# Patient Record
Sex: Male | Born: 1960
Health system: Southern US, Community
[De-identification: ages and names within clinical notes are randomized; demographics above are authoritative.]

## PROBLEM LIST (undated history)

## (undated) DIAGNOSIS — N400 Enlarged prostate without lower urinary tract symptoms: Secondary | ICD-10-CM

## (undated) HISTORY — DX: Benign prostatic hyperplasia without lower urinary tract symptoms: N40.0

---

## 2001-01-11 ENCOUNTER — Emergency Department (HOSPITAL_COMMUNITY): Admission: EM | Admit: 2001-01-11 | Discharge: 2001-01-11 | Payer: Self-pay | Admitting: Emergency Medicine

## 2001-01-11 ENCOUNTER — Encounter: Payer: Self-pay | Admitting: Emergency Medicine

## 2007-08-25 ENCOUNTER — Ambulatory Visit: Payer: Self-pay | Admitting: Gastroenterology

## 2011-02-22 ENCOUNTER — Ambulatory Visit: Payer: Self-pay | Admitting: Orthopedic Surgery

## 2011-05-29 HISTORY — PX: KNEE SURGERY: SHX244

## 2011-11-06 ENCOUNTER — Ambulatory Visit: Payer: Self-pay | Admitting: Family Medicine

## 2012-07-01 ENCOUNTER — Ambulatory Visit: Payer: Self-pay | Admitting: Family Medicine

## 2012-12-29 ENCOUNTER — Ambulatory Visit: Payer: Self-pay | Admitting: Family Medicine

## 2013-10-06 IMAGING — CR DG CHEST 2V
1 series · 2 of 2 positions shown · non-contrast
Comparison: none

REASON FOR EXAM: cough
COMMENTS:

PROCEDURE:     KDR - KDXR CHEST PA (OR AP) AND LAT  - December 29, 2012 [DATE]
RESULT:     Is The lungs are clear. The heart and pulmonary vessels are
normal. The bony and mediastinal structures are unremarkable. There is no
effusion. There is no pneumothorax or evidence of congestive failure.

[Series 1: pa · 0.17mm/px · 2 of 2 slices shown]
[im 1/2]
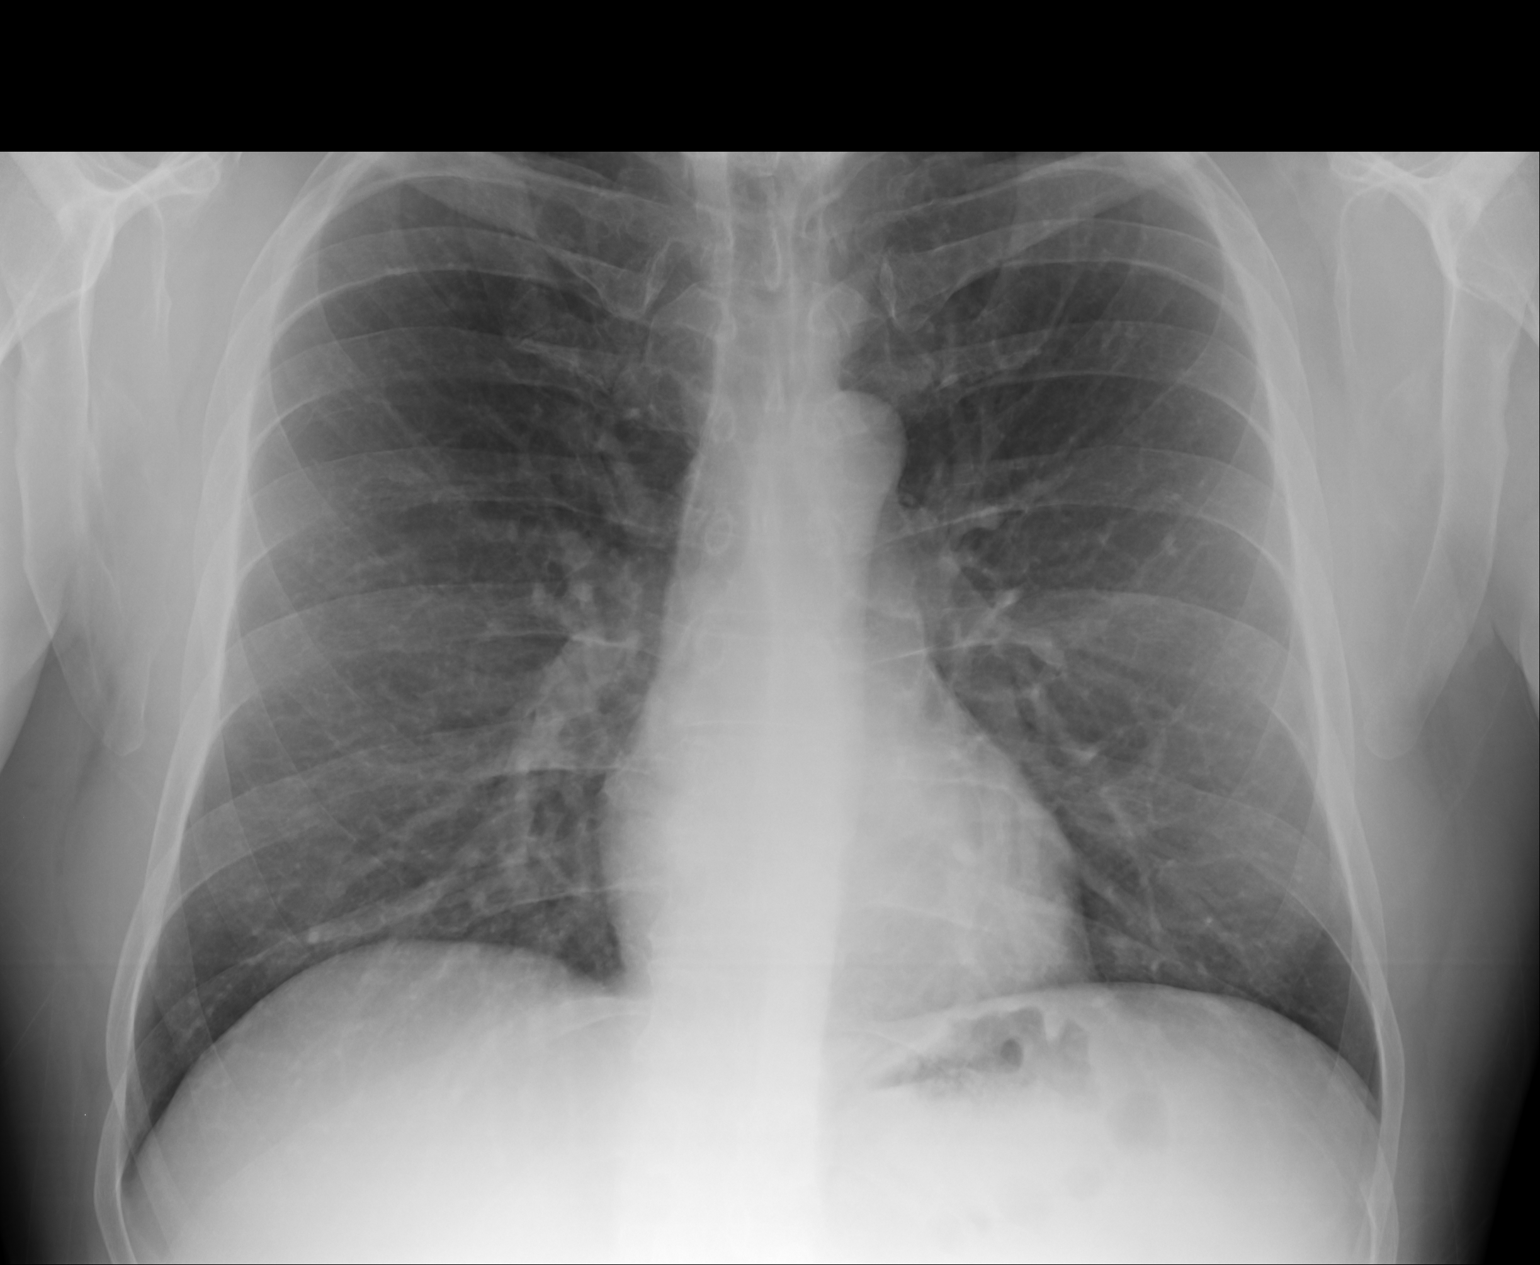
[im 2/2]
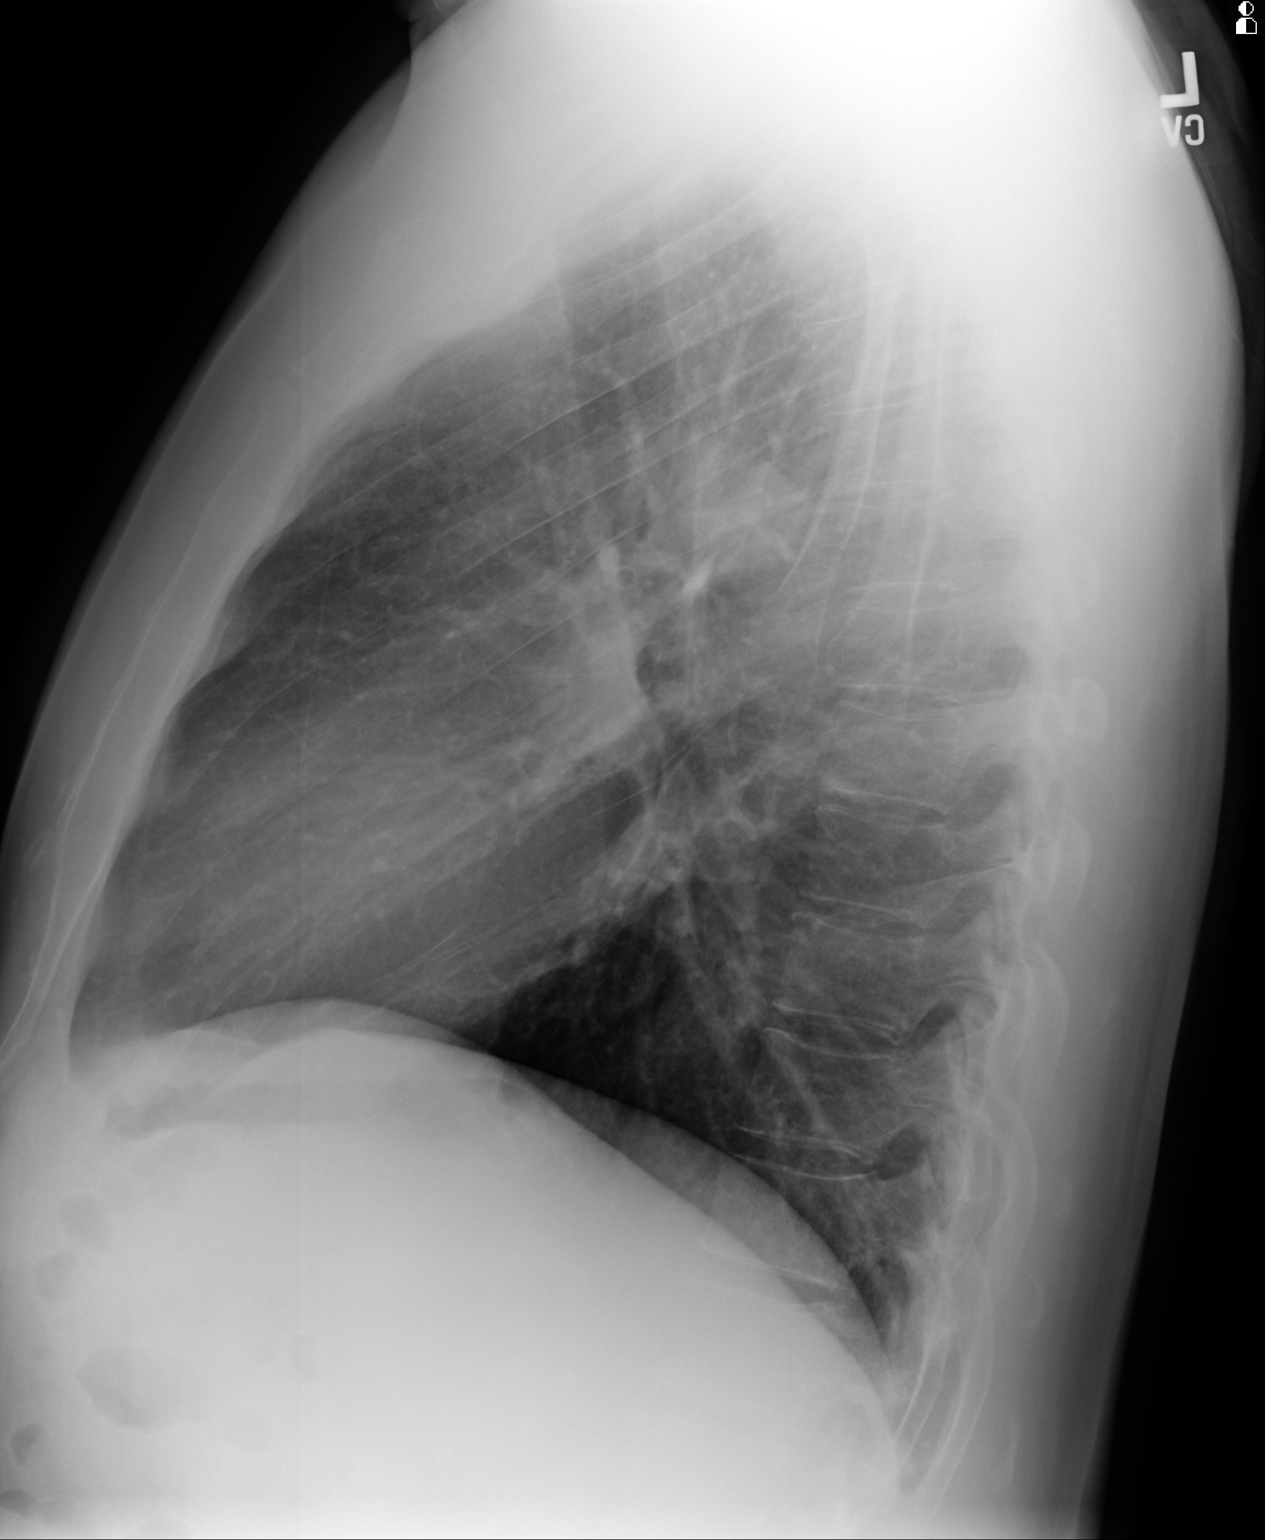

[2 of 2 positions shown; findings below may reference images not displayed]

IMPRESSION: No acute cardiopulmonary disease. Stable compared to
11/06/2011.

[REDACTED]

## 2013-11-05 LAB — PSA: PSA: 1

## 2013-11-05 LAB — LIPID PANEL
CHOLESTEROL: 177 mg/dL (ref 0–200)
HDL: 66 mg/dL (ref 35–70)
LDL Cholesterol: 96 mg/dL
TRIGLYCERIDES: 76 mg/dL (ref 40–160)

## 2013-11-05 LAB — BASIC METABOLIC PANEL
BUN: 14 mg/dL (ref 4–21)
CREATININE: 1 mg/dL (ref 0.6–1.3)
Glucose: 95 mg/dL
Potassium: 4.6 mmol/L (ref 3.4–5.3)
SODIUM: 140 mmol/L (ref 137–147)

## 2015-10-31 ENCOUNTER — Emergency Department
Admission: EM | Admit: 2015-10-31 | Discharge: 2015-10-31 | Disposition: A | Discharge status: Home / Self Care | Payer: 59 | Attending: Emergency Medicine | Admitting: Emergency Medicine

## 2015-10-31 DIAGNOSIS — T783XXA Angioneurotic edema, initial encounter: Secondary | ICD-10-CM | POA: Diagnosis not present

## 2015-10-31 DIAGNOSIS — Z7982 Long term (current) use of aspirin: Secondary | ICD-10-CM | POA: Insufficient documentation

## 2015-10-31 LAB — CBC WITH DIFFERENTIAL/PLATELET
Basophils Absolute: 0.1 10*3/uL (ref 0–0.1)
Basophils Relative: 1 %
Eosinophils Absolute: 0.5 10*3/uL (ref 0–0.7)
Eosinophils Relative: 7 %
HCT: 43 % (ref 40.0–52.0)
Hemoglobin: 14.7 g/dL (ref 13.0–18.0)
Lymphocytes Relative: 44 %
Lymphs Abs: 3.3 10*3/uL (ref 1.0–3.6)
MCH: 30.2 pg (ref 26.0–34.0)
MCHC: 34.2 g/dL (ref 32.0–36.0)
MCV: 88.3 fL (ref 80.0–100.0)
Monocytes Absolute: 0.7 10*3/uL (ref 0.2–1.0)
Monocytes Relative: 9 %
Neutro Abs: 3 10*3/uL (ref 1.4–6.5)
Neutrophils Relative %: 39 %
Platelets: 206 10*3/uL (ref 150–440)
RBC: 4.87 MIL/uL (ref 4.40–5.90)
RDW: 12.7 % (ref 11.5–14.5)
WBC: 7.6 10*3/uL (ref 3.8–10.6)

## 2015-10-31 LAB — BASIC METABOLIC PANEL
Anion gap: 6 (ref 5–15)
BUN: 23 mg/dL — ABNORMAL HIGH (ref 6–20)
CO2: 27 mmol/L (ref 22–32)
Calcium: 8.9 mg/dL (ref 8.9–10.3)
Chloride: 107 mmol/L (ref 101–111)
Creatinine, Ser: 0.96 mg/dL (ref 0.61–1.24)
GFR calc Af Amer: >60 mL/min (ref >60)
GFR calc non Af Amer: >60 mL/min (ref >60)
Glucose, Bld: 98 mg/dL (ref 65–99)
Potassium: 3.6 mmol/L (ref 3.5–5.1)
Sodium: 140 mmol/L (ref 135–145)

## 2015-10-31 MED ORDER — EPINEPHRINE 0.3 MG/0.3ML IJ SOAJ: 0.3000 mg | Freq: Once | INTRAMUSCULAR | Status: DC

## 2015-10-31 MED ORDER — SODIUM CHLORIDE 0.9 % IV BOLUS (SEPSIS)
1000.0000 mL | Freq: Once | INTRAVENOUS | Status: AC
  Administered 2015-10-31: 1000 mL via INTRAVENOUS

## 2015-10-31 MED ORDER — FAMOTIDINE IN NACL 20-0.9 MG/50ML-% IV SOLN
20.0000 mg | Freq: Once | INTRAVENOUS | Status: AC
  Administered 2015-10-31: 20 mg via INTRAVENOUS
  Filled 2015-10-31: qty 50

## 2015-10-31 MED ORDER — METHYLPREDNISOLONE SODIUM SUCC 125 MG IJ SOLR: 125.0000 mg | Freq: Once | INTRAMUSCULAR | Status: DC

## 2015-10-31 MED ORDER — PREDNISONE 20 MG PO TABS: 60.0000 mg | ORAL_TABLET | Freq: Every day | ORAL | Status: DC

## 2015-10-31 MED ORDER — METHYLPREDNISOLONE SODIUM SUCC 125 MG IJ SOLR
125.0000 mg | Freq: Once | INTRAMUSCULAR | Status: AC
  Administered 2015-10-31: 125 mg via INTRAVENOUS

## 2015-10-31 MED ORDER — EPINEPHRINE HCL 1 MG/ML IJ SOLN
INTRAMUSCULAR | Status: AC
  Filled 2015-10-31: qty 1

## 2015-10-31 MED ORDER — EPINEPHRINE 0.3 MG/0.3ML IJ SOAJ: 0.3000 mg | Freq: Once | INTRAMUSCULAR | Status: AC

## 2015-10-31 MED ORDER — DIPHENHYDRAMINE HCL 50 MG/ML IJ SOLN
25.0000 mg | Freq: Once | INTRAMUSCULAR | Status: AC
  Administered 2015-10-31: 25 mg via INTRAVENOUS

## 2015-10-31 MED ORDER — FAMOTIDINE 20 MG PO TABS
40.0000 mg | ORAL_TABLET | Freq: Once | ORAL | Status: AC
  Administered 2015-10-31: 40 mg via ORAL
  Filled 2015-10-31: qty 2

## 2015-10-31 MED ORDER — DIPHENHYDRAMINE HCL 50 MG/ML IJ SOLN: 25.0000 mg | Freq: Once | INTRAMUSCULAR | Status: DC

## 2015-10-31 MED ORDER — EPINEPHRINE HCL 1 MG/ML IJ SOLN
0.3000 mg | Freq: Once | INTRAMUSCULAR | Status: AC
  Administered 2015-10-31: 0.3 mg via INTRAMUSCULAR

## 2015-10-31 NOTE — ED Notes (Signed)
Pt reports he woke up around 4 pm with swelling to his tongue which has gotten worse.

## 2015-10-31 NOTE — ED Notes (Signed)
Pt. States he woke up at 4 am this morning with tongue swollen.  Pt. Denies any change to diet or medication.  Pt. Denies any similar condition in the past.

## 2015-10-31 NOTE — Discharge Instructions (Signed)
Angioedema °Angioedema is a sudden swelling of tissues, often of the skin. It can occur on the face or genitals or in the abdomen or other body parts. The swelling usually develops over a short period and gets better in 24 to 48 hours. It often begins during the night and is found when the person wakes up. The person may also get red, itchy patches of skin (hives). Angioedema can be dangerous if it involves swelling of the air passages.  °Depending on the cause, episodes of angioedema may only happen once, come back in unpredictable patterns, or repeat for several years and then gradually fade away.  °CAUSES  °Angioedema can be caused by an allergic reaction to various triggers. It can also result from nonallergic causes, including reactions to drugs, immune system disorders, viral infections, or an abnormal gene that is passed to you from your parents (hereditary). For some people with angioedema, the cause is unknown.  °Some things that can trigger angioedema include:  °· Foods.   °· Medicines, such as ACE inhibitors, ARBs, nonsteroidal anti-inflammatory agents, or estrogen.   °· Latex.   °· Animal saliva.   °· Insect stings.   °· Dyes used in X-rays.   °· Mild injury.   °· Dental work. °· Surgery. °· Stress.   °· Sudden changes in temperature.   °· Exercise. °SIGNS AND SYMPTOMS  °· Swelling of the skin. °· Hives. If these are present, there is also intense itching. °· Redness in the affected area.   °· Pain in the affected area. °· Swollen lips or tongue. °· Breathing problems. This may happen if the air passages swell. °· Wheezing. °If internal organs are involved, there may be:  °· Nausea.   °· Abdominal pain.   °· Vomiting.   °· Difficulty swallowing.   °· Difficulty passing urine. °DIAGNOSIS  °· Your health care provider will examine the affected area and take a medical and family history. °· Various tests may be done to help determine the cause. Tests may include: °¨ Allergy skin tests to see if the problem  is an allergic reaction.   °¨ Blood tests to check for hereditary angioedema.   °¨ Tests to check for underlying diseases that could cause the condition.   °· A review of your medicines, including over-the-counter medicines, may be done. °TREATMENT  °Treatment will depend on the cause of the angioedema. Possible treatments include:  °· Removal of anything that triggered the condition (such as stopping certain medicines).   °· Medicines to treat symptoms or prevent attacks. Medicines given may include:   °¨ Antihistamines.   °¨ Epinephrine injection.   °¨ Steroids.   °· Hospitalization may be required for severe attacks. If the air passages are affected, it can be an emergency. Tubes may need to be placed to keep the airway open. °HOME CARE INSTRUCTIONS  °· Take all medicines as directed by your health care provider. °· If you were given medicines for emergency allergy treatment, always carry them with you. °· Wear a medical bracelet as directed by your health care provider.   °· Avoid known triggers. °SEEK MEDICAL CARE IF:  °· You have repeat attacks of angioedema.   °· Your attacks are more frequent or more severe despite preventive measures.   °· You have hereditary angioedema and are considering having children. It is important to discuss with your health care provider the risks of passing the condition on to your children. °SEEK IMMEDIATE MEDICAL CARE IF:  °· You have severe swelling of the mouth, tongue, or lips. °· You have difficulty breathing.   °· You have difficulty swallowing.   °· You faint. °MAKE   SURE YOU:  Understand these instructions.  Will watch your condition.  Will get help right away if you are not doing well or get worse.   This information is not intended to replace advice given to you by your health care provider. Make sure you discuss any questions you have with your health care provider.   Document Released: 07/23/2001 Document Revised: 06/04/2014 Document Reviewed:  01/05/2013 Elsevier Interactive Patient Education 2016 [employer]  Take 50 mg of Benadryl 4 times a day today. Take the prednisone 3 pills once a day today tomorrow in the day after. Return if the swelling begins to come back. If the swelling gets very bad use the EpiPen and call 911. Lee's follow-up with your doctor later this week.

## 2015-10-31 NOTE — ED Provider Notes (Signed)
[facility] Emergency Department Provider Note   ____________________________________________  Time seen: Immediately upon arrival to treatment room 19  I have reviewed the triage vital signs and the nursing notes.   HISTORY  Chief Complaint Angioedema    HPI Jonathan Gallegos is a 55 y.o. male who presents to the ED from home with a chief complaint of tongue swelling. Patient reports he has been eating peanuts all weekend without difficulty. Took a [employer] powder approximately 6 PM last evening for headache. Awoke around proximately 4 AM with swelling to his tongue with associated drooling of saliva. Denies breathing difficulty or rash. States he has taken Goody's powder and eaten peanuts previously without adverse reaction. Denies new food, exposure, medicines. Denies taking ACE inhibitor. Denies recent fever, chills, chest pain, shortness of breath, abdominal pain, nausea, vomiting, diarrhea. Did not take anything prior to arrival. Nothing makes his symptoms better or worse.   Past medical history None  There are no active problems to display for this patient.   No past surgical history on file.  No current outpatient prescriptions on file.  Allergies Review of patient's allergies indicates no known allergies.  No family history on file.  Social History Social History  Substance Use Topics  . Smoking status: Not on file  . Smokeless tobacco: Not on file  . Alcohol Use: Not on file  No recent EtOH  Review of Systems  Constitutional: No fever/chills. Eyes: No visual changes. ENT: Positive for tongue swelling. No sore throat. Cardiovascular: Denies chest pain. Respiratory: Denies shortness of breath. Gastrointestinal: No abdominal pain.  No nausea, no vomiting.  No diarrhea.  No constipation. Genitourinary: Negative for dysuria. Musculoskeletal: Negative for back pain. Skin: Negative for rash. Neurological: Negative for headaches,  focal weakness or numbness.  10-point ROS otherwise negative.  ____________________________________________   PHYSICAL EXAM:  VITAL SIGNS: ED Triage Vitals  Enc Vitals Group     BP 10/31/15 0532 134/74 mmHg     Pulse Rate 10/31/15 0532 74     Resp 10/31/15 0532 18     Temp 10/31/15 0532 98.7 F (37.1 C)     Temp Source 10/31/15 0532 Oral     SpO2 10/31/15 0532 96 %     Weight 10/31/15 0532 215 lb (97.523 kg)     Height 10/31/15 0532 6\' 1"  (1.854 m)     Head Cir --      Peak Flow --      Pain Score 10/31/15 0533 0     Pain Loc --      Pain Edu? --      Excl. in GC? --     Constitutional: Alert and oriented. Well appearing and in no acute distress. Eyes: Conjunctivae are normal. PERRL. EOMI. Head: Atraumatic. Nose: No congestion/rhinnorhea. Mouth/Throat: Symmetrically swollen tongue which is not protruding out of patient's mouth. Able to visualize posterior oropharynx. Mild swelling to uvula. Patient is able to control secretions. He has a mildly hoarse voice. There is no muffled voice. There is no drooling. Mucous membranes are moist.  Oropharynx non-erythematous. Neck: No stridor.  Soft submental space. Cardiovascular: Normal rate, regular rhythm. Grossly normal heart sounds.  Good peripheral circulation. Respiratory: Normal respiratory effort.  No retractions. Lungs CTAB. Gastrointestinal: Soft and nontender. No distention. No abdominal bruits. No CVA tenderness. Musculoskeletal: No lower extremity tenderness nor edema.  No joint effusions. Neurologic:  Normal speech and language. No gross focal neurologic deficits are appreciated. No gait instability. Skin:  Skin  is warm, dry and intact. No rash noted. Psychiatric: Mood and affect are normal. Speech and behavior are normal.  ____________________________________________   LABS (all labs ordered are listed, but only abnormal results are displayed)  Labs Reviewed  CBC WITH DIFFERENTIAL/PLATELET  BASIC METABOLIC PANEL     ____________________________________________  EKG  ED ECG REPORT I, Ayannah Faddis J, the attending physician, personally viewed and interpreted this ECG.   Date: 10/31/2015  EKG Time: 0643  Rate: 73  Rhythm: normal EKG, normal sinus rhythm  Axis: Normal  Intervals:none  ST&T Change: Nonspecific  ____________________________________________  RADIOLOGY  None ____________________________________________   PROCEDURES  Procedure(s) performed: None  Critical Care performed: No  ____________________________________________   INITIAL IMPRESSION / ASSESSMENT AND PLAN / ED COURSE  Pertinent labs & imaging results that were available during my care of the patient were reviewed by me and considered in my medical decision making (see chart for details).  55 year old male who presents with angioedema. Room air saturations 96% without respiratory distress. Will initiate IM epinephrine, IV Solu-Medrol, Pepcid, Benadryl and monitor.  ----------------------------------------- 6:45 AM on 10/31/2015 -----------------------------------------  Patient states he is feeling slightly improved. Feels like he is able to swallow better. Voice is slightly more clear. Will continue to monitor. Tolerating secretions well.  ----------------------------------------- 7:01 AM on 10/31/2015 -----------------------------------------  Care transferred to Dr. Darnelle Catalan. Would observe a minimum of 4 hours. Consider additional IV Benadryl and IV Decadron as needed for further decrease in tongue swelling. Disposition depending on clinical course. If discharged, would refer to ENT for allergy testing. ____________________________________________   FINAL CLINICAL IMPRESSION(S) / ED DIAGNOSES  Final diagnoses:  Angioedema, initial encounter      NEW MEDICATIONS STARTED DURING THIS VISIT:  New Prescriptions   No medications on file     Note:  This document was prepared using Dragon voice  recognition software and may include unintentional dictation errors.      Irean Hong, MD 10/31/15 [phone]

## 2015-11-02 ENCOUNTER — Ambulatory Visit (INDEPENDENT_AMBULATORY_CARE_PROVIDER_SITE_OTHER): Payer: 59 | Admitting: Family Medicine

## 2015-11-02 VITALS — BP 118/62 | HR 68 | Temp 98.1°F | Resp 14 | Wt 220.0 lb

## 2015-11-02 DIAGNOSIS — I83891 Varicose veins of right lower extremities with other complications: Secondary | ICD-10-CM | POA: Diagnosis not present

## 2015-11-02 DIAGNOSIS — K648 Other hemorrhoids: Secondary | ICD-10-CM | POA: Insufficient documentation

## 2015-11-02 DIAGNOSIS — R221 Localized swelling, mass and lump, neck: Secondary | ICD-10-CM | POA: Diagnosis not present

## 2015-11-02 DIAGNOSIS — T783XXA Angioneurotic edema, initial encounter: Secondary | ICD-10-CM | POA: Diagnosis not present

## 2015-11-02 NOTE — Progress Notes (Signed)
Patient ID: Jonathan Gallegos, male   DOB: 1961/04/27, 55 y.o.   MRN: [id number]   Jonathan Gallegos  MRN: [id number] DOB: 08-Mar-1961  Subjective:  HPI   The patient is a 55 year old male who was evaluated in the [facility] ED on 10/31/15 for swelling of his tongue.  He was diagnosed with angioedema at the time of his discharge.  While in the ED he was given Epinephrine IM, IV Solu-Medrol, along with Pepcid and Benadryl.  He was sent home with Epi Pen and Prednisone prescriptions.  He has not yet started the Prednisone.  The patient states he was starting to do better until today at about 11:30 he noticed that the bottom of his right felt like it had a knot in it, the he had numbness and swelling of that foot.  He took a Benadryl about 12:00 and made the appointment to be seen. He has not been on any ACE or ARB and to his knowledge he is not allergic to anything, other than getting some hives after eating salmon.  He has since eaten Salmon and had no reaction.  He did state that over the weekend he had gotten peanuts and ate several of them throughout the weekend.  He has never had an allergic reaction to peanuts before.  He also stated that today he noticed on his abdomen around his naval he has what might be a bite that he did not have over the weekend.  He admits to having had several tick bites with the last one being about a week ago.  He has on his right back around the shoulder blade an area where it may be slightly infected from the tick bite.   He has been working with walnut wood and as a result has been breathing in a lot of that sawdust.  Patient Active Problem List   Diagnosis Date Noted  . Bleeding internal hemorrhoids 11/02/2015    No past medical history on file.  Social History   Social History  . Marital Status: Married    Spouse Name: N/A  . Number of Children: 2  . Years of Education: N/A   Occupational History  . Not on file.   Social History Main Topics  . Smoking  status: Never Smoker   . Smokeless tobacco: Not on file  . Alcohol Use: Not on file  . Drug Use: No  . Sexual Activity: Not on file   Other Topics Concern  . Not on file   Social History Narrative    Outpatient Prescriptions Prior to Visit  Medication Sig Dispense Refill  . Aspirin-Caffeine 845-65 MG PACK Take 1 packet by mouth daily.    Marland Kitchen EPINEPHrine (EPIPEN 2-PAK) 0.3 mg/0.3 mL IJ SOAJ injection Inject 0.3 mLs (0.3 mg total) into the muscle once. 1 Device 1  . predniSONE (DELTASONE) 20 MG tablet Take 3 tablets (60 mg total) by mouth daily. (Patient not taking: Reported on 11/02/2015) 9 tablet 0   No facility-administered medications prior to visit.    No Known Allergies  Review of Systems  Constitutional: Negative for fever and malaise/fatigue.  Respiratory: Negative for cough, shortness of breath and wheezing.   Cardiovascular: Negative for chest pain, palpitations and leg swelling.  Skin: Negative for itching and rash.       No rash just one spot on abdomen and one on his back.  Neurological: Positive for sensory change (right foot from swelling) and headaches. Negative for dizziness, tingling and weakness.  Objective:  BP 118/62 mmHg  Pulse 68  Temp(Src) 98.1 F (36.7 C) (Oral)  Resp 14  Wt 220 lb (99.791 kg)  Physical Exam  General Appearance:    Alert, cooperative, no distress  HENT:   ENT exam normal, no neck nodes or sinus tenderness  Eyes:    PERRL, conjunctiva/corneas clear, EOM's intact       Lungs:     Clear to auscultation bilaterally, respirations unlabored  Heart:    Regular rate and rhythm  Neurologic:   Awake, alert, oriented x 3. No apparent focal neurological           defect.   Ext:  1+ edema right foot and ankle. Non-tender. Moderate varicosities.       Assessment and Plan :  1. Angioedema, initial encounter No clear triggers. He has filled Epi-pen and advised to keep with him at all times. Refer for allergy testing.  - Ambulatory referral  to Allergy  2. Throat swelling  - Ambulatory referral to Allergy  3. Right LE edema Secondary to venous insufficiency. Elevate leg when not ambulating. Can use compression stocking prn.   Mila Merryonald Eydan Chianese, MD [facility] 11/02/2015 3:20 PM

## 2015-11-14 ENCOUNTER — Ambulatory Visit (INDEPENDENT_AMBULATORY_CARE_PROVIDER_SITE_OTHER): Payer: 59 | Admitting: Family Medicine

## 2015-11-14 ENCOUNTER — Encounter: Payer: Self-pay | Admitting: Family Medicine

## 2015-11-14 VITALS — BP 102/60 | HR 56 | Temp 97.7°F | Resp 16 | Wt 218.0 lb

## 2015-11-14 DIAGNOSIS — T7840XA Allergy, unspecified, initial encounter: Secondary | ICD-10-CM | POA: Diagnosis not present

## 2015-11-14 MED ORDER — PREDNISONE 20 MG PO TABS: ORAL_TABLET | ORAL | Status: DC

## 2015-11-14 NOTE — Patient Instructions (Signed)
Continue Zyrtec and add Benadryl and Pepcid or Zantac (twice daily). Let us know if you are not improving.

## 2015-11-14 NOTE — Progress Notes (Signed)
Subjective:     Patient ID: Jonathan Gallegos, male   DOB: [birth year], 55 y.o.   MRN: [id number]  HPI  Chief Complaint  Patient presents with  . Facial Swelling    since this morning. Patient reports that he has had several incidents where he has had swelling. Patient reports that he was seen in the ED and here in our office due to swelling. Patient reports that he was taking prednisone, and he finished the last dose about 1 week ago. He also reports that he has a scheduled appt with the allergist, but he is not able to see them until mid July.   Reports that he first noticed transient swelling under his right arm on 6/16. The following day he experienced right finger swelling. Today his left jaw is swollen. No tongue swelling or trouble breathing. He has kept a log of what he eats but can find no commonality between the two episodes. Reports prior tick bites and eats honey regularly as he has a bee hive. Has taken Zyrtec today with mild improvement in his jaw swelling.   Review of Systems     Objective:   Physical Exam  Constitutional: He appears well-developed and well-nourished. No distress.  HENT:  Mouth/Throat: Oropharynx is clear and moist.  Pulmonary/Chest: Breath sounds normal.  Skin:  Left lower jaw is swollen but no rash or tenderness appreciated.       Assessment:    1. Allergic reaction, initial encounter - predniSONE (DELTASONE) 20 MG tablet; Taper as follows: 3 pills for 4 days, two pills for 4 days, one pill for four days  Dispense: 24 tablet; Refill: 0    Plan:    Discussed use of Zantac and Benadryl. He will try to get an earlier appointment with the allergist.

## 2015-11-15 ENCOUNTER — Encounter: Payer: Self-pay | Admitting: Family Medicine

## 2016-03-16 ENCOUNTER — Telehealth: Payer: Self-pay

## 2016-03-16 ENCOUNTER — Encounter: Payer: Self-pay | Admitting: Family Medicine

## 2016-03-16 NOTE — Telephone Encounter (Signed)
Patient called and states that he has had some chest discomfort in middle of the chest off and on, no pattern, not with exertion. He had an episode in July when he was on the boat where he had sharp pain that lasted for 5 minutes but did not seek treatment at that time. He is not in distress now and just thought maybe he should get this addressed. No other symptoms like numbness or tingling, no heartburn, no cough. Spoke with Nadine CountsBob and patient is fine to wait till next week. Advised patient if he develops any other symptoms or worsening of symptoms to seek medical treatment right away. Patient Kathyrn Lassunderstood-aa

## 2016-03-20 ENCOUNTER — Ambulatory Visit (INDEPENDENT_AMBULATORY_CARE_PROVIDER_SITE_OTHER): Payer: 59 | Admitting: Family Medicine

## 2016-03-20 ENCOUNTER — Encounter: Payer: Self-pay | Admitting: Family Medicine

## 2016-03-20 VITALS — BP 108/64 | HR 83 | Temp 98.3°F | Resp 16 | Wt 219.8 lb

## 2016-03-20 DIAGNOSIS — R0789 Other chest pain: Secondary | ICD-10-CM

## 2016-03-20 DIAGNOSIS — R1319 Other dysphagia: Secondary | ICD-10-CM

## 2016-03-20 DIAGNOSIS — Z13220 Encounter for screening for lipoid disorders: Secondary | ICD-10-CM

## 2016-03-20 DIAGNOSIS — Z131 Encounter for screening for diabetes mellitus: Secondary | ICD-10-CM

## 2016-03-20 NOTE — Patient Instructions (Signed)
We will call you with the lab results and the time for the g.i. Referral.

## 2016-03-20 NOTE — Progress Notes (Signed)
Subjective:     Patient ID: Jonathan Gallegos, male   DOB: 08/23/1960, 55 y.o.   MRN: [id number]  HPI  Chief Complaint  Patient presents with  . Chest Pain    Patient comes in office today with concerns of chest pain for the past week intermittent. Patient states that pain began when he was on vacation, patient describes pain as a sharp shooting pain localized in the middle of his chest. Patient denies headache,shortness of breath, blurred vision or numbness when these events occur.  Reports episode in July occurred while he was on vacation. Describes a pulsing pain in his mid chest which last for 5 minutes. Last week he had a dull mid chest discomfort which last the entire day then resolved. Has hx of excellent cholesterol profile in 2015. States he has chronic troubles with difficulty swallowing for years and occasional heartburn. Feels cold liquid triggers his esophagus to close and must use warm water to relieve or throw food up. Prior EGD in 2001 was normal but esophagus was dilated at that time.   Review of Systems  Constitutional:       Recently underwent full allergy specialist evaluation for recurrent angioedema. Was felt to have mild Actigall allergy. Patient states he has not had recurrence of his sx despite resuming beef consumption.       Objective:   Physical Exam  Constitutional: He appears well-developed and well-nourished. No distress.  Cardiovascular: Normal rate and regular rhythm.   Pulmonary/Chest: Breath sounds normal.  Musculoskeletal: He exhibits no edema (of lower extremities).       Assessment:    1. Chest discomfort: will update lipid profile; suspect is G.I. trigger   2. Screening for cholesterol level - Lipid panel  3. Other dysphagia - Ambulatory referral to Gastroenterology  4. Screening for diabetes mellitus - Comprehensive metabolic panel    Plan:    Further f/u pending lab work.

## 2016-03-22 ENCOUNTER — Telehealth: Payer: Self-pay

## 2016-03-22 LAB — LIPID PANEL
CHOLESTEROL TOTAL: 201 mg/dL — AB (ref 100–199)
Chol/HDL Ratio: 3 ratio units (ref 0.0–5.0)
HDL: 66 mg/dL (ref >39)
LDL CALC: 119 mg/dL — AB (ref 0–99)
TRIGLYCERIDES: 79 mg/dL (ref 0–149)
VLDL Cholesterol Cal: 16 mg/dL (ref 5–40)

## 2016-03-22 LAB — COMPREHENSIVE METABOLIC PANEL
ALK PHOS: 52 IU/L (ref 39–117)
ALT: 13 IU/L (ref 0–44)
AST: 19 IU/L (ref 0–40)
Albumin/Globulin Ratio: 1.9 (ref 1.2–2.2)
Albumin: 4.5 g/dL (ref 3.5–5.5)
BILIRUBIN TOTAL: 0.6 mg/dL (ref 0.0–1.2)
BUN/Creatinine Ratio: 13 (ref 9–20)
BUN: 13 mg/dL (ref 6–24)
CHLORIDE: 99 mmol/L (ref 96–106)
CO2: 27 mmol/L (ref 18–29)
CREATININE: 0.98 mg/dL (ref 0.76–1.27)
Calcium: 9.6 mg/dL (ref 8.7–10.2)
GFR calc Af Amer: 101 mL/min/{1.73_m2} (ref >59)
GFR calc non Af Amer: 87 mL/min/{1.73_m2} (ref >59)
GLOBULIN, TOTAL: 2.4 g/dL (ref 1.5–4.5)
GLUCOSE: 98 mg/dL (ref 65–99)
Potassium: 4.6 mmol/L (ref 3.5–5.2)
SODIUM: 141 mmol/L (ref 134–144)
Total Protein: 6.9 g/dL (ref 6.0–8.5)

## 2016-03-22 NOTE — Telephone Encounter (Signed)
-----   Message from Anola Gurneyobert Chauvin, [place] sent at 03/22/2016  7:40 AM EDT ----- Labs are good. You have mildly elevated cholesterol but your calculated 10 years risk for developing cardiovascular disease is low at 3%. We usually recommend a cholesterol lowering drug at 7.5% risk.

## 2016-03-22 NOTE — Telephone Encounter (Signed)
LMTCB-KW 

## 2016-03-23 NOTE — Telephone Encounter (Signed)
Patient advised as below. Patient verbalizes understanding and is in agreement with treatment plan.  

## 2016-05-01 ENCOUNTER — Telehealth: Payer: Self-pay | Admitting: Family Medicine

## 2016-05-01 NOTE — Telephone Encounter (Signed)
Pt called saying he came in a bout a month or 2 ago.  He had labs done but he said he didn't get a PSA done.  He states he would like to have one done.  He said he has been having frequent urination,  His call back is [phone]  Thanks, Barth Kirksteri

## 2016-05-01 NOTE — Telephone Encounter (Signed)
Patient last had PSA drawn 11/05/13, please review. KW

## 2016-05-02 ENCOUNTER — Other Ambulatory Visit: Payer: Self-pay | Admitting: Family Medicine

## 2016-05-02 DIAGNOSIS — Z125 Encounter for screening for malignant neoplasm of prostate: Secondary | ICD-10-CM

## 2016-05-02 NOTE — Telephone Encounter (Signed)
I sent PSA order to [facility]. If not available when patient requests, may call for paper lab slip.

## 2016-05-02 NOTE — Telephone Encounter (Signed)
Left patient message notifying him that lab has been ordered to have drawn. KW

## 2016-05-03 ENCOUNTER — Other Ambulatory Visit: Payer: Self-pay | Admitting: Family Medicine

## 2016-05-04 ENCOUNTER — Telehealth: Payer: Self-pay

## 2016-05-04 LAB — PSA: PROSTATE SPECIFIC AG, SERUM: 1.3 ng/mL (ref 0.0–4.0)

## 2016-05-04 NOTE — Telephone Encounter (Signed)
-----   Message from Anola Gurneyobert Chauvin, [place] sent at 05/04/2016  7:36 AM EST ----- Normal PSA

## 2016-05-04 NOTE — Telephone Encounter (Signed)
LMTCB-KW 

## 2016-05-04 NOTE — Telephone Encounter (Signed)
Patient has been advised. KW 

## 2016-08-10 ENCOUNTER — Encounter: Payer: Self-pay | Admitting: Family Medicine

## 2016-08-10 ENCOUNTER — Encounter: Payer: 59 | Admitting: Family Medicine

## 2016-08-10 ENCOUNTER — Ambulatory Visit (INDEPENDENT_AMBULATORY_CARE_PROVIDER_SITE_OTHER): Payer: 59 | Admitting: Family Medicine

## 2016-08-10 VITALS — BP 106/60 | HR 68 | Temp 98.5°F | Resp 16 | Wt 225.0 lb

## 2016-08-10 DIAGNOSIS — Z23 Encounter for immunization: Secondary | ICD-10-CM

## 2016-08-10 NOTE — Patient Instructions (Addendum)
Call next March, 2019 to schedule colonoscopy.

## 2016-08-10 NOTE — Progress Notes (Signed)
Here for nurse visit for Tdap. Last colonoscopy normal in March of 2009 by Dr. Servando SnareWohl. Recommended we refer him in one year.

## 2016-09-20 ENCOUNTER — Other Ambulatory Visit: Payer: Self-pay | Admitting: Family Medicine

## 2016-09-20 ENCOUNTER — Telehealth: Payer: Self-pay | Admitting: Family Medicine

## 2016-09-20 DIAGNOSIS — Z1211 Encounter for screening for malignant neoplasm of colon: Secondary | ICD-10-CM

## 2016-09-20 DIAGNOSIS — K625 Hemorrhage of anus and rectum: Secondary | ICD-10-CM

## 2016-09-20 NOTE — Progress Notes (Signed)
Patient would like for Nadine Counts to return his call. CB# [phone]

## 2016-09-20 NOTE — Telephone Encounter (Signed)
Patient concerned about bright red rectal bleeding. Last colonoscopy in 2009, Wishes early colonoscopy but I told him that would be up to G.I. Referral in progress.

## 2016-09-20 NOTE — Telephone Encounter (Signed)
Jonathan Gallegos, The patient states that his insurance company said that they would pay for him to have it done now.  I asked him if he was having any problems to why he wanted it now and he said he was having some bleeding but didn't want the insurance company to know

## 2016-09-20 NOTE — Telephone Encounter (Signed)
Please review

## 2016-09-20 NOTE — Telephone Encounter (Signed)
He won't be due until 2019. I have discussed that with him previously

## 2016-09-20 NOTE — Progress Notes (Signed)
Discussed changing G.I. Referral to screening for colon cancer.

## 2016-09-20 NOTE — Telephone Encounter (Signed)
Pt is requesting a referral to have a colonoscopy with Dr Clydene Pugh at [facility].  CB#[phone]/[phone]

## 2016-09-20 NOTE — Telephone Encounter (Signed)
LMTCB ED 

## 2016-09-21 ENCOUNTER — Other Ambulatory Visit: Payer: Self-pay

## 2016-09-21 ENCOUNTER — Telehealth: Payer: Self-pay

## 2016-09-21 DIAGNOSIS — Z1211 Encounter for screening for malignant neoplasm of colon: Secondary | ICD-10-CM

## 2016-09-21 NOTE — Telephone Encounter (Signed)
Gastroenterology Pre-Procedure Review  Request Date: May 11th Requesting Physician: Dr. Tobi Bastos  PATIENT REVIEW QUESTIONS: The patient responded to the following health history questions as indicated:    1. Are you having any GI issues? yes (hemrrhoids) 2. Do you have a personal history of Polyps? no 3. Do you have a family history of Colon Cancer or Polyps? yes (dad polyps) 4. Diabetes Mellitus? no 5. Joint replacements in the past 12 months?no 6. Major health problems in the past 3 months?no 7. Any artificial heart valves, MVP, or defibrillator?no    MEDICATIONS & ALLERGIES:    Patient reports the following regarding taking any anticoagulation/antiplatelet therapy:   Plavix, Coumadin, Eliquis, Xarelto, Lovenox, Pradaxa, Brilinta, or Effient? no Aspirin? Aspirin- Caffeine  Patient confirms/reports the following medications:  Current Outpatient Prescriptions  Medication Sig Dispense Refill  . Aspirin-Caffeine 845-65 MG PACK Take 1 packet by mouth daily.    Marland Kitchen EPINEPHrine (EPIPEN 2-PAK) 0.3 mg/0.3 mL IJ SOAJ injection Inject 0.3 mLs (0.3 mg total) into the muscle once. 1 Device 1   No current facility-administered medications for this visit.     Patient confirms/reports the following allergies:  No Known Allergies  No orders of the defined types were placed in this encounter.   AUTHORIZATION INFORMATION Primary Insurance: 1D#: Group #:  Secondary Insurance: 1D#: Group #:  SCHEDULE INFORMATION: Date:  Time: Location:

## 2016-09-24 ENCOUNTER — Telehealth: Payer: Self-pay | Admitting: Gastroenterology

## 2016-09-24 NOTE — Telephone Encounter (Signed)
09/24/16 Spoke with Corrie Dandy at [facility] for Screening Colonoscopy [address] / Z12.11 Auth #: [id number]

## 2016-10-05 ENCOUNTER — Ambulatory Visit: Payer: 59 | Admitting: Anesthesiology

## 2016-10-05 ENCOUNTER — Ambulatory Visit
Admission: RE | Admit: 2016-10-05 | Discharge: 2016-10-05 | Disposition: A | Discharge status: Home / Self Care | Payer: 59 | Source: Ambulatory Visit | Attending: Gastroenterology | Admitting: Gastroenterology

## 2016-10-05 ENCOUNTER — Encounter: Payer: Self-pay | Admitting: *Deleted

## 2016-10-05 ENCOUNTER — Encounter
Admission: RE | Disposition: A | Discharge status: Home / Self Care | Payer: Self-pay | Source: Ambulatory Visit | Attending: Gastroenterology

## 2016-10-05 DIAGNOSIS — K640 First degree hemorrhoids: Secondary | ICD-10-CM | POA: Diagnosis not present

## 2016-10-05 DIAGNOSIS — Z1211 Encounter for screening for malignant neoplasm of colon: Secondary | ICD-10-CM | POA: Insufficient documentation

## 2016-10-05 DIAGNOSIS — Z8371 Family history of colonic polyps: Secondary | ICD-10-CM | POA: Diagnosis not present

## 2016-10-05 DIAGNOSIS — I739 Peripheral vascular disease, unspecified: Secondary | ICD-10-CM | POA: Insufficient documentation

## 2016-10-05 HISTORY — PX: COLONOSCOPY WITH PROPOFOL: SHX5780

## 2016-10-05 SURGERY — COLONOSCOPY WITH PROPOFOL
Anesthesia: General

## 2016-10-05 MED ORDER — FENTANYL CITRATE (PF) 100 MCG/2ML IJ SOLN
INTRAMUSCULAR | Status: AC
  Filled 2016-10-05: qty 2

## 2016-10-05 MED ORDER — FENTANYL CITRATE (PF) 100 MCG/2ML IJ SOLN
INTRAMUSCULAR | Status: DC | PRN
  Administered 2016-10-05 (×2): 50 ug via INTRAVENOUS

## 2016-10-05 MED ORDER — PROPOFOL 500 MG/50ML IV EMUL
INTRAVENOUS | Status: AC
  Filled 2016-10-05: qty 50

## 2016-10-05 MED ORDER — PROPOFOL 10 MG/ML IV BOLUS
INTRAVENOUS | Status: DC | PRN
  Administered 2016-10-05: 20 mg via INTRAVENOUS
  Administered 2016-10-05: 80 mg via INTRAVENOUS

## 2016-10-05 MED ORDER — MIDAZOLAM HCL 2 MG/2ML IJ SOLN
INTRAMUSCULAR | Status: DC | PRN
  Administered 2016-10-05: 2 mg via INTRAVENOUS

## 2016-10-05 MED ORDER — SODIUM CHLORIDE 0.9 % IV SOLN
INTRAVENOUS | Status: DC
  Administered 2016-10-05: 09:00:00 via INTRAVENOUS

## 2016-10-05 MED ORDER — EPHEDRINE SULFATE 50 MG/ML IJ SOLN
INTRAMUSCULAR | Status: DC | PRN
  Administered 2016-10-05: 10 mg via INTRAVENOUS

## 2016-10-05 MED ORDER — PROPOFOL 500 MG/50ML IV EMUL
INTRAVENOUS | Status: DC | PRN
  Administered 2016-10-05: 175 ug/kg/min via INTRAVENOUS

## 2016-10-05 MED ORDER — LIDOCAINE HCL (CARDIAC) 20 MG/ML IV SOLN
INTRAVENOUS | Status: DC | PRN
  Administered 2016-10-05: 40 mg via INTRAVENOUS

## 2016-10-05 MED ORDER — MIDAZOLAM HCL 2 MG/2ML IJ SOLN
INTRAMUSCULAR | Status: AC
  Filled 2016-10-05: qty 2

## 2016-10-05 NOTE — Transfer of Care (Signed)
Immediate Anesthesia Transfer of Care Note  Patient: Lenox PondsChristopher G Degeorge  Procedure(s) Performed: Procedure(s): COLONOSCOPY WITH PROPOFOL (N/A)  Patient Location: PACU and Endoscopy Unit  Anesthesia Type:General  Level of Consciousness: sedated  Airway & Oxygen Therapy: Patient Spontanous Breathing and Patient connected to nasal cannula oxygen  Post-op Assessment: Report given to RN and Post -op Vital signs reviewed and stable  Post vital signs: Reviewed and stable  Last Vitals:  Vitals:   10/05/16 0900 10/05/16 1028  BP: 110/66 (!) 118/59  Pulse: 66 65  Resp: 18 12  Temp: (!) 35.9 C     Complications: No apparent anesthesia complications

## 2016-10-05 NOTE — H&P (Signed)
  Jonathan MoodKiran Jarae Panas MD [address] [place], [place] [address] Phone: [phone] Fax : [phone]  Primary Care Physician:  Anola Gurneyhauvin, Robert, [place] Primary Gastroenterologist:  Dr. Wyline MoodKiran Fraser Gallegos   Pre-Procedure History & Physical: HPI:  Jonathan Gallegos is a 56 y.o. male is here for an colonoscopy.   History reviewed. No pertinent past medical history.  Past Surgical History:  Procedure Laterality Date  . KNEE SURGERY Right 2013    Prior to Admission medications   Medication Sig Start Date End Date Taking? Authorizing Provider  Aspirin-Caffeine 845-65 MG PACK Take 1 packet by mouth daily.   Yes [provider]  EPINEPHrine (EPIPEN 2-PAK) 0.3 mg/0.3 mL IJ SOAJ injection Inject 0.3 mLs (0.3 mg total) into the muscle once. 10/31/15   Arnaldo NatalMalinda, Paul F, MD    Allergies as of 09/21/2016  . (No Known Allergies)    Family History  Problem Relation Age of Onset  . Bipolar disorder Mother   . Hypertension Father   . Cancer Father   . Colon polyps Father   . Leukemia Paternal Grandfather     Social History   Social History  . Marital status: Married    Spouse name: N/A  . Number of children: 2  . Years of education: N/A   Occupational History  . Not on file.   Social History Main Topics  . Smoking status: Never Smoker  . Smokeless tobacco: Never Used  . Alcohol use 0.0 oz/week     Comment: ocassionaly  . Drug use: No  . Sexual activity: Not on file   Other Topics Concern  . Not on file   Social History Narrative  . No narrative on file    Review of Systems: See HPI, otherwise negative ROS  Physical Exam: BP 110/66   Pulse 66   Temp (!) 96.6 F (35.9 C) (Tympanic)   Resp 18   Ht 6\' 1"  (1.854 m)   Wt 210 lb (95.3 kg)   SpO2 98%   BMI 27.71 kg/m  General:   Alert,  pleasant and cooperative in NAD Head:  Normocephalic and atraumatic. Neck:  Supple; no masses or thyromegaly. Lungs:  Clear throughout to auscultation.    Heart:  Regular rate  and rhythm. Abdomen:  Soft, nontender and nondistended. Normal bowel sounds, without guarding, and without rebound.   Neurologic:  Alert and  oriented x4;  grossly normal neurologically.  Impression/Plan: Jonathan Gallegos is here for an colonoscopy to be performed for colorectal cancer screening due to family history of colon polyps   Risks, benefits, limitations, and alternatives regarding  colonoscopy have been reviewed with the patient.  Questions have been answered.  All parties agreeable.   Jonathan MoodKiran Catori Panozzo, MD  10/05/2016, 9:53 AM

## 2016-10-05 NOTE — Anesthesia Post-op Follow-up Note (Cosign Needed)
Anesthesia QCDR form completed.        

## 2016-10-05 NOTE — Anesthesia Preprocedure Evaluation (Signed)
Anesthesia Evaluation  Patient identified by MRN, date of birth, ID band Patient awake    Reviewed: Allergy & Precautions, H&P , NPO status , Patient's Chart, lab work & pertinent test results, reviewed documented beta blocker date and time   Airway Mallampati: III   Neck ROM: full    Dental  (+) Poor Dentition   Pulmonary neg pulmonary ROS,    Pulmonary exam normal        Cardiovascular Exercise Tolerance: Good + Peripheral Vascular Disease  negative cardio ROS Normal cardiovascular exam Rhythm:regular Rate:Normal     Neuro/Psych negative neurological ROS  negative psych ROS   GI/Hepatic negative GI ROS, Neg liver ROS,   Endo/Other  negative endocrine ROS  Renal/GU negative Renal ROS  negative genitourinary   Musculoskeletal   Abdominal   Peds  Hematology negative hematology ROS (+)   Anesthesia Other Findings History reviewed. No pertinent past medical history. Past Surgical History: 2013: KNEE SURGERY Right BMI    Body Mass Index:  27.71 kg/m     Reproductive/Obstetrics negative OB ROS                             Anesthesia Physical Anesthesia Plan  ASA: II  Anesthesia Plan: General   Post-op Pain Management:    Induction:   Airway Management Planned:   Additional Equipment:   Intra-op Plan:   Post-operative Plan:   Informed Consent: I have reviewed the patients History and Physical, chart, labs and discussed the procedure including the risks, benefits and alternatives for the proposed anesthesia with the patient or authorized representative who has indicated his/her understanding and acceptance.   Dental Advisory Given  Plan Discussed with: CRNA  Anesthesia Plan Comments:         Anesthesia Quick Evaluation

## 2016-10-05 NOTE — Anesthesia Procedure Notes (Signed)
Date/Time: 10/05/2016 9:58 AM Performed by: Stormy FabianURTIS, Henslee Lottman Pre-anesthesia Checklist: Patient identified, Emergency Drugs available, Suction available and Patient being monitored Patient Re-evaluated:Patient Re-evaluated prior to inductionOxygen Delivery Method: Nasal cannula Intubation Type: IV induction Dental Injury: Teeth and Oropharynx as per pre-operative assessment  Comments: Nasal cannula with etCO2 monitoring

## 2016-10-05 NOTE — Op Note (Signed)
[facility] Gastroenterology Patient Name: Jonathan Gallegos Procedure Date: 10/05/2016 9:54 AM MRN: [id number] Account #: [id number] Date of Birth: [birth year] Admit Type: Outpatient Age: 56 Room: [facility] ENDO ROOM 4 Gender: Male Note Status: Finalized Procedure:            Colonoscopy Indications:          Colon cancer screening in patient at increased risk:                        Family history of 1st-degree relative with colon polyps Providers:            Wyline Mood MD, MD Referring MD:         Les Pou. Benancio Deeds, MD (Referring MD) Medicines:            Monitored Anesthesia Care Complications:        No immediate complications. Procedure:            Pre-Anesthesia Assessment:                       - Prior to the procedure, a History and Physical was                        performed, and patient medications, allergies and                        sensitivities were reviewed. The patient's tolerance of                        previous anesthesia was reviewed.                       - The risks and benefits of the procedure and the                        sedation options and risks were discussed with the                        patient. All questions were answered and informed                        consent was obtained.                       - ASA Grade Assessment: II - A patient with mild                        systemic disease.                       After obtaining informed consent, the colonoscope was                        passed under direct vision. Throughout the procedure,                        the patient's blood pressure, pulse, and oxygen                        saturations were monitored continuously. The  Colonoscope was introduced through the anus and                        advanced to the the cecum, identified by the                        appendiceal orifice, IC valve and transillumination.                        The colonoscopy was  performed with ease. The patient                        tolerated the procedure well. The quality of the bowel                        preparation was good. Findings:      The perianal and digital rectal examinations were normal.      Non-bleeding internal hemorrhoids were found during retroflexion. The       hemorrhoids were medium-sized and Grade I (internal hemorrhoids that do       not prolapse).      The exam was otherwise without abnormality on direct and retroflexion       views. Impression:           - Non-bleeding internal hemorrhoids.                       - The examination was otherwise normal on direct and                        retroflexion views.                       - No specimens collected. Recommendation:       - Discharge patient to home (with escort).                       - Resume previous diet.                       - Continue present medications.                       - Repeat colonoscopy in 5 years for surveillance. Procedure Code(s):    --- Professional ---                       Z6109, Colorectal cancer screening; colonoscopy on                        individual at high risk Diagnosis Code(s):    --- Professional ---                       K64.0, First degree hemorrhoids                       Z83.71, Family history of colonic polyps CPT copyright 2016 American Medical Association. All rights reserved. The codes documented in this report are preliminary and upon coder review may  be revised to meet current compliance requirements. Wyline Mood, MD Wyline Mood MD, MD 10/05/2016 10:22:42 AM This report has been signed electronically. Number of Addenda: 0 Note Initiated  On: 10/05/2016 9:54 AM Scope Withdrawal Time: 0 hours 15 minutes 32 seconds  Total Procedure Duration: 0 hours 20 minutes 58 seconds       [facility]

## 2016-10-08 ENCOUNTER — Encounter: Payer: Self-pay | Admitting: Gastroenterology

## 2016-10-08 NOTE — Anesthesia Postprocedure Evaluation (Signed)
Anesthesia Post Note  Patient: Jonathan Gallegos  Procedure(s) Performed: Procedure(s) (LRB): COLONOSCOPY WITH PROPOFOL (N/A)  Patient location during evaluation: PACU Anesthesia Type: General Level of consciousness: awake and alert Pain management: pain level controlled Vital Signs Assessment: post-procedure vital signs reviewed and stable Respiratory status: spontaneous breathing, nonlabored ventilation, respiratory function stable and patient connected to nasal cannula oxygen Cardiovascular status: blood pressure returned to baseline and stable Postop Assessment: no signs of nausea or vomiting Anesthetic complications: no     Last Vitals:  Vitals:   10/05/16 1048 10/05/16 1058  BP: 122/72 111/76  Pulse: 62 (!) 56  Resp: 17 12  Temp:      Last Pain:  Vitals:   10/05/16 1028  TempSrc: Tympanic                 Jonathan Gallegos

## 2017-07-29 ENCOUNTER — Ambulatory Visit: Payer: 59 | Admitting: Family Medicine

## 2017-07-29 ENCOUNTER — Encounter: Payer: Self-pay | Admitting: Family Medicine

## 2017-07-29 ENCOUNTER — Ambulatory Visit (INDEPENDENT_AMBULATORY_CARE_PROVIDER_SITE_OTHER): Payer: 59 | Admitting: Family Medicine

## 2017-07-29 VITALS — BP 110/68 | HR 68 | Temp 98.5°F | Resp 16 | Ht 73.0 in | Wt 219.0 lb

## 2017-07-29 DIAGNOSIS — L309 Dermatitis, unspecified: Secondary | ICD-10-CM | POA: Diagnosis not present

## 2017-07-29 DIAGNOSIS — Z1159 Encounter for screening for other viral diseases: Secondary | ICD-10-CM

## 2017-07-29 DIAGNOSIS — Z13220 Encounter for screening for lipoid disorders: Secondary | ICD-10-CM | POA: Diagnosis not present

## 2017-07-29 DIAGNOSIS — M25562 Pain in left knee: Secondary | ICD-10-CM

## 2017-07-29 DIAGNOSIS — Z125 Encounter for screening for malignant neoplasm of prostate: Secondary | ICD-10-CM | POA: Diagnosis not present

## 2017-07-29 DIAGNOSIS — Z131 Encounter for screening for diabetes mellitus: Secondary | ICD-10-CM

## 2017-07-29 MED ORDER — FLUOCINONIDE 0.05 % EX CREA: 1.0000 "application " | TOPICAL_CREAM | Freq: Three times a day (TID) | CUTANEOUS | 1 refills | Status: DC

## 2017-07-29 NOTE — Progress Notes (Signed)
Subjective:     Patient ID: Jonathan Gallegos, male   DOB: Nov 04, 1960, 57 y.o.   MRN: [id number] Chief Complaint  Patient presents with  . lab screening    Patient is requesting to have Hep c screeing done.  . Knee Pain    Patient c/o left knee pain that has been ongoing for several months. He wants to discuss a possible ortho referral.    HPI Wishes to update other labs as well. Reports left medial knee with mild nagging injury since last Fall. Recently his knee  was swelling which responded to Aleve and a brace. Hx of right knee meniscal surgery. Also reports hx of ear eczema but has noted itchy, scaly, annular lesions on his bilateral posterior forearms and right lower leg. Review of Systems     Objective:   Physical Exam  Constitutional: He appears well-developed and well-nourished. No distress.  Musculoskeletal:  Left knee with specific area of tenderness around his medial tibial plateau. KF/KE 5/5, FROM, knee ligaments stable. McMurray's test negative.  Skin:  Annular, scaly patches with confluent erythema on his posterior forearms and right lower leg (denuded from scratching)       Assessment:    1. Encounter for hepatitis C screening test for low risk patient - Hepatitis C Antibody  2. Screening for diabetes mellitus - Comprehensive metabolic panel  3. Screening for prostate cancer - PSA  4. Screening for cholesterol level - Lipid panel  5. Left medial knee pain: suspect osteoarthritis - DG Knee Complete 4 Views Left; Future  6. Eczema, unspecified type - fluocinonide cream (LIDEX) 0.05 %; Apply 1 application topically 3 (three) times daily. To eczema rash  Dispense: 60 g; Refill: 1    Plan:    Further f/u pending lab and x-ray results.

## 2017-07-29 NOTE — Patient Instructions (Signed)
We will call you with the lab and x-ray results. 

## 2017-07-31 ENCOUNTER — Telehealth: Payer: Self-pay

## 2017-07-31 LAB — COMPREHENSIVE METABOLIC PANEL
A/G RATIO: 1.6 (ref 1.2–2.2)
ALBUMIN: 4.3 g/dL (ref 3.5–5.5)
ALT: 10 IU/L (ref 0–44)
AST: 15 IU/L (ref 0–40)
Alkaline Phosphatase: 60 IU/L (ref 39–117)
BILIRUBIN TOTAL: 0.5 mg/dL (ref 0.0–1.2)
BUN / CREAT RATIO: 18 (ref 9–20)
BUN: 17 mg/dL (ref 6–24)
CHLORIDE: 101 mmol/L (ref 96–106)
CO2: 25 mmol/L (ref 20–29)
Calcium: 9.5 mg/dL (ref 8.7–10.2)
Creatinine, Ser: 0.95 mg/dL (ref 0.76–1.27)
GFR calc non Af Amer: 90 mL/min/{1.73_m2} (ref >59)
GFR, EST AFRICAN AMERICAN: 104 mL/min/{1.73_m2} (ref >59)
Globulin, Total: 2.7 g/dL (ref 1.5–4.5)
Glucose: 99 mg/dL (ref 65–99)
POTASSIUM: 4.3 mmol/L (ref 3.5–5.2)
SODIUM: 140 mmol/L (ref 134–144)
TOTAL PROTEIN: 7 g/dL (ref 6.0–8.5)

## 2017-07-31 LAB — PSA: Prostate Specific Ag, Serum: 1.3 ng/mL (ref 0.0–4.0)

## 2017-07-31 LAB — HEPATITIS C ANTIBODY: Hep C Virus Ab: <0.1 s/co ratio (ref 0.0–0.9)

## 2017-07-31 LAB — LIPID PANEL
Chol/HDL Ratio: 2.8 ratio (ref 0.0–5.0)
Cholesterol, Total: 184 mg/dL (ref 100–199)
HDL: 65 mg/dL (ref >39)
LDL Calculated: 108 mg/dL — ABNORMAL HIGH (ref 0–99)
Triglycerides: 56 mg/dL (ref 0–149)
VLDL Cholesterol Cal: 11 mg/dL (ref 5–40)

## 2017-07-31 NOTE — Telephone Encounter (Signed)
[id number] 

## 2017-07-31 NOTE — Telephone Encounter (Signed)
-----   Message from Anola Gurneyobert Chauvin, [place] sent at 07/31/2017  7:30 AM EST ----- All labs look good with minimally elevated cholesterol.

## 2017-07-31 NOTE — Telephone Encounter (Signed)
Patient advised.KW 

## 2017-09-19 ENCOUNTER — Ambulatory Visit
Admission: RE | Admit: 2017-09-19 | Discharge: 2017-09-19 | Disposition: A | Discharge status: Home / Self Care | Payer: 59 | Source: Ambulatory Visit | Attending: Family Medicine | Admitting: Family Medicine

## 2017-09-19 DIAGNOSIS — M25562 Pain in left knee: Secondary | ICD-10-CM

## 2017-09-19 DIAGNOSIS — M1712 Unilateral primary osteoarthritis, left knee: Secondary | ICD-10-CM | POA: Insufficient documentation

## 2017-09-19 DIAGNOSIS — M25569 Pain in unspecified knee: Secondary | ICD-10-CM | POA: Diagnosis present

## 2017-09-20 ENCOUNTER — Other Ambulatory Visit: Payer: Self-pay | Admitting: Family Medicine

## 2017-09-20 ENCOUNTER — Telehealth: Payer: Self-pay

## 2017-09-20 DIAGNOSIS — M25562 Pain in left knee: Principal | ICD-10-CM

## 2017-09-20 DIAGNOSIS — G8929 Other chronic pain: Secondary | ICD-10-CM

## 2017-09-20 NOTE — Telephone Encounter (Signed)
Patient has been advised and would like to proceed with referral. He states that he has [facility] and request to get appt with him if possible. KW

## 2017-09-20 NOTE — Telephone Encounter (Signed)
-----   Message from Anola Gurneyobert Chauvin, [place] sent at 09/19/2017  1:42 PM EDT ----- Cathlean SauerX-ray is consistent with arthritis particularly on the inner aspect where you were tender on exam. Do you wish orthopedic referral?

## 2017-09-20 NOTE — Telephone Encounter (Signed)
Referral in progress. 

## 2018-06-27 IMAGING — CR DG KNEE COMPLETE 4+V*L*
1 series · 5 of 5 positions shown · non-contrast
Comparison: None.

CLINICAL DATA: Pain medially in the left knee for 6 months with
some swelling, no injury

EXAM:
LEFT KNEE - COMPLETE 4+ VIEW

[Series 1: dg knee complete 4 views left · 0.14mm/px · 5 of 5 slices shown]
[im 1/5]
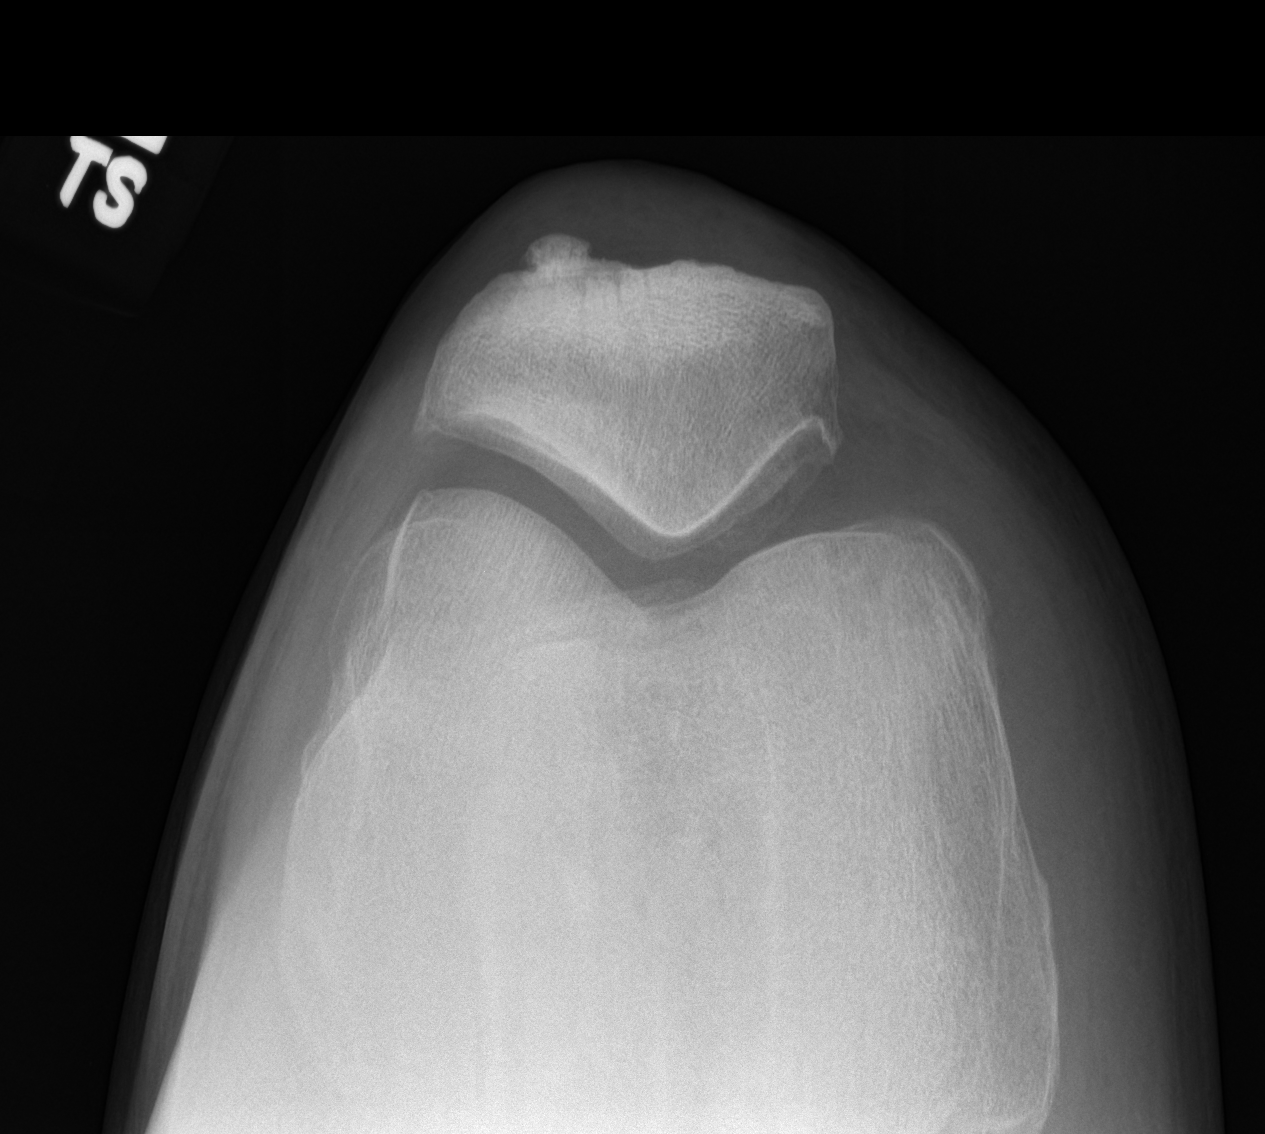
[im 2/5]
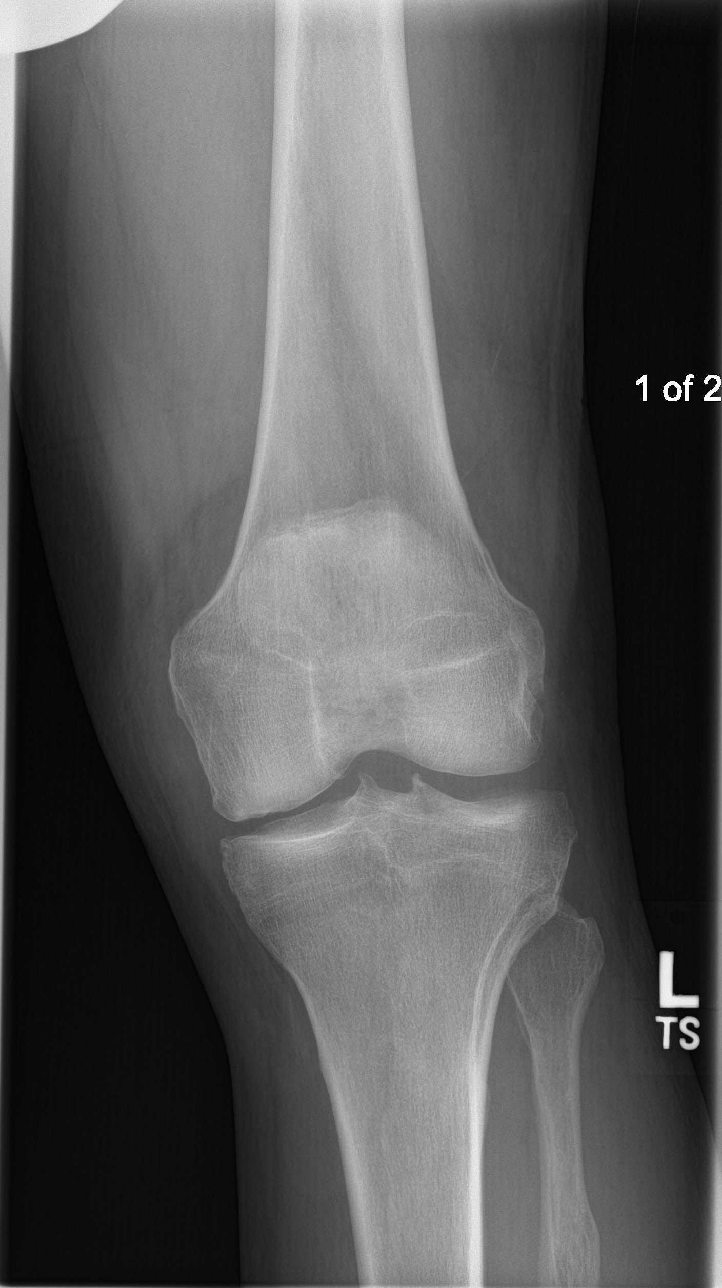
[im 3/5]
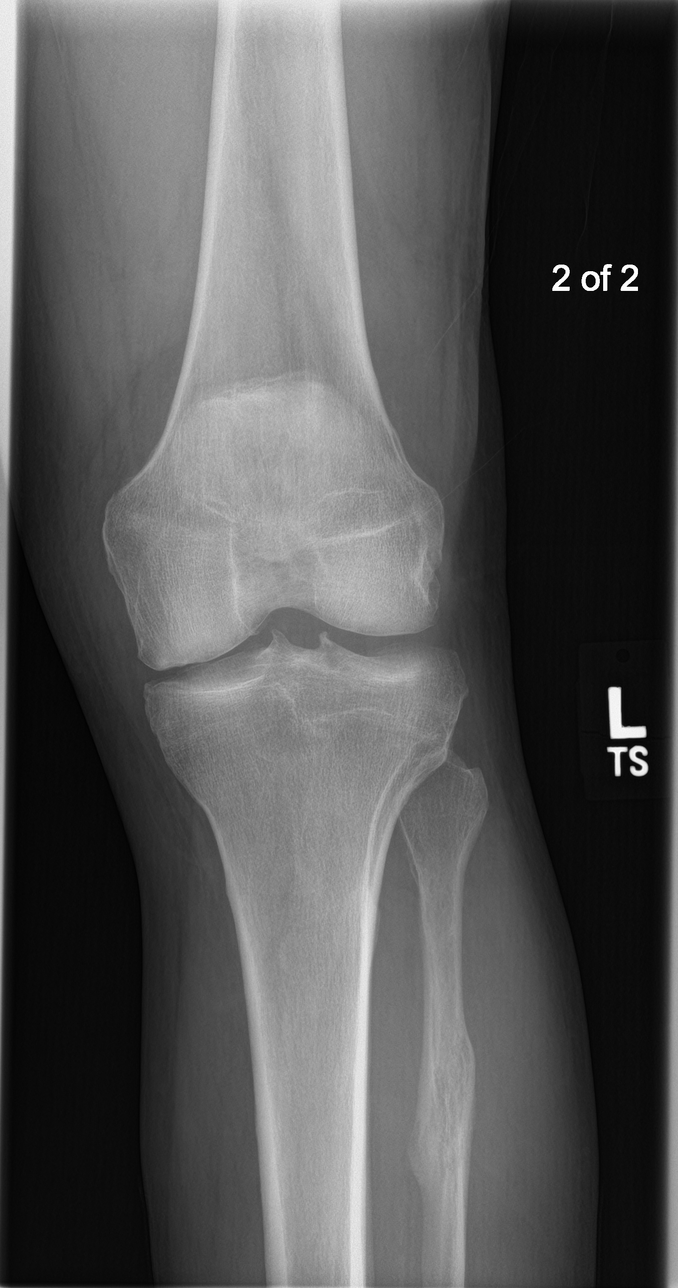
[im 4/5]
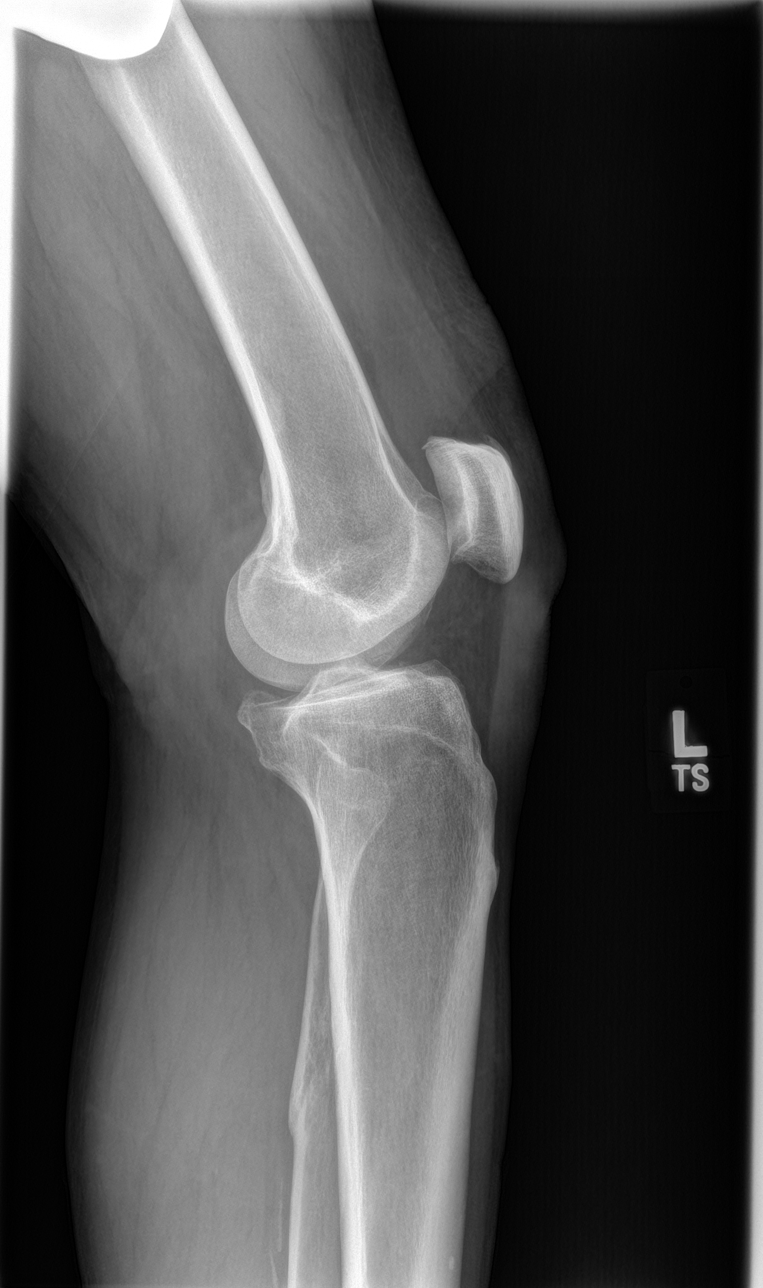
[im 5/5]
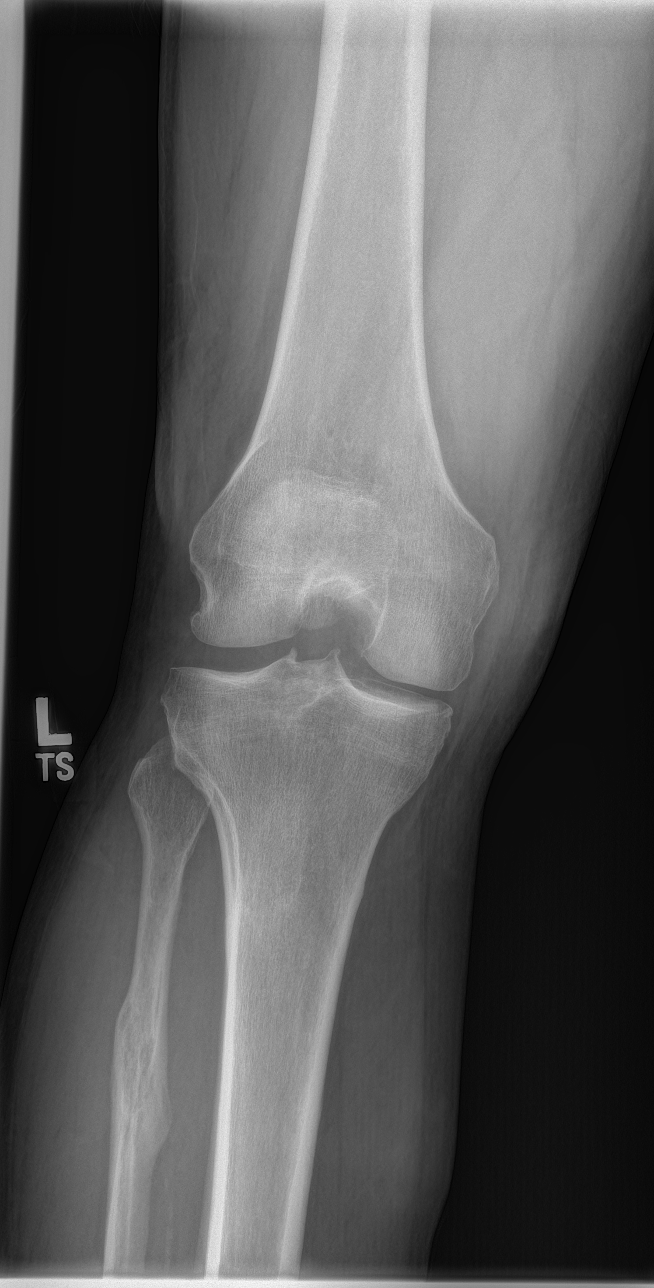

[5 of 5 positions shown; findings below may reference images not displayed]

FINDINGS: There is mild bicompartmental degenerative joint disease the left
knee with bony spurring from the medial and patellofemoral
compartments. However there is slightly more loss of joint space
involving the medial compartment with slight sclerosis indicating
slightly more advanced degenerative change. No joint effusion is
seen. A healed fracture of the proximal left fibula is noted.
IMPRESSION: 1. Bicompartmental degenerative joint disease of the left knee
primarily involving the medial compartment where there is more loss
of joint space.
2. No fracture or effusion.
3. Old healed fracture of the proximal left fibula.

## 2019-01-06 ENCOUNTER — Encounter: Payer: Self-pay | Admitting: Physician Assistant

## 2019-01-06 ENCOUNTER — Telehealth: Payer: 59 | Admitting: Family

## 2019-01-06 ENCOUNTER — Ambulatory Visit (INDEPENDENT_AMBULATORY_CARE_PROVIDER_SITE_OTHER): Payer: 59 | Admitting: Physician Assistant

## 2019-01-06 VITALS — Wt 210.0 lb

## 2019-01-06 DIAGNOSIS — R350 Frequency of micturition: Secondary | ICD-10-CM | POA: Diagnosis not present

## 2019-01-06 DIAGNOSIS — Z0189 Encounter for other specified special examinations: Secondary | ICD-10-CM | POA: Diagnosis not present

## 2019-01-06 DIAGNOSIS — Z008 Encounter for other general examination: Secondary | ICD-10-CM

## 2019-01-06 DIAGNOSIS — R300 Dysuria: Secondary | ICD-10-CM

## 2019-01-06 NOTE — Patient Instructions (Signed)
Urinary Frequency, Adult Urinary frequency means urinating more often than usual. You may urinate every 1-2 hours even though you drink a normal amount of fluid and do not have a bladder infection or condition. Although you urinate more often than normal, the total amount of urine produced in a day is normal. With urinary frequency, you may have an urgent need to urinate often. The stress and anxiety of needing to find a bathroom quickly can make this urge worse. This condition may go away on its own or you may need treatment at home. Home treatment may include bladder training, exercises, taking medicines, or making changes to your diet. Follow these instructions at home: Bladder health   Keep a bladder diary if told by your health care provider. Keep track of: ? What you eat and drink. ? How often you urinate. ? How much you urinate.  Follow a bladder training program if told by your health care provider. This may include: ? Learning to delay going to the bathroom. ? Double urinating (voiding). This helps if you are not completely emptying your bladder. ? Scheduled voiding.  Do Kegel exercises as told by your health care provider. Kegel exercises strengthen the muscles that help control urination, which may help the condition. Eating and drinking  If told by your health care provider, make diet changes, such as: ? Avoiding caffeine. ? Drinking fewer fluids, especially alcohol. ? Not drinking in the evening. ? Avoiding foods or drinks that may irritate the bladder. These include coffee, tea, soda, artificial sweeteners, citrus, tomato-based foods, and chocolate. ? Eating foods that help prevent or ease constipation. Constipation can make this condition worse. Your health care provider may recommend that you:  Drink enough fluid to keep your urine pale yellow.  Take over-the-counter or prescription medicines.  Eat foods that are high in fiber, such as beans, whole grains, and fresh  fruits and vegetables.  Limit foods that are high in fat and processed sugars, such as fried or sweet foods. General instructions  Take over-the-counter and prescription medicines only as told by your health care provider.  Keep all follow-up visits as told by your health care provider. This is important. Contact a health care provider if:  You start urinating more often.  You feel pain or irritation when you urinate.  You notice blood in your urine.  Your urine looks cloudy.  You develop a fever.  You begin vomiting. Get help right away if:  You are unable to urinate. Summary  Urinary frequency means urinating more often than usual. With urinary frequency, you may urinate every 1-2 hours even though you drink a normal amount of fluid and do not have a bladder infection or other bladder condition.  Your health care provider may recommend that you keep a bladder diary, follow a bladder training program, or make dietary changes.  If told by your health care provider, do Kegel exercises to strengthen the muscles that help control urination.  Take over-the-counter and prescription medicines only as told by your health care provider.  Contact a health care provider if your symptoms do not improve or get worse. This information is not intended to replace advice given to you by your health care provider. Make sure you discuss any questions you have with your health care provider. Document Released: 03/10/2009 Document Revised: 11/21/2017 Document Reviewed: 11/21/2017 Elsevier Patient Education  2020 Elsevier Inc.  

## 2019-01-06 NOTE — Progress Notes (Signed)
Patient: Jonathan Gallegos Male    DOB: Sep 24, 1960   58 y.o.   MRN: [id number] Visit Date: 01/06/2019  Today's Provider: Mar Daring, PA-C   Chief Complaint  Patient presents with  . Urinary Frequency   Subjective:     Virtual Visit via Video Note  I connected with Jonathan Gallegos on 01/06/19 at  3:00 PM EDT by a video enabled telemedicine application and verified that I am speaking with the correct person using two identifiers.  Location: Patient: In car safely stopped Provider: [facility]   I discussed the limitations of evaluation and management by telemedicine and the availability of in person appointments. The patient expressed understanding and agreed to proceed.   HPI  Patient reports that he has been having frequent urination since last November and would like his PSA check. He denies any pain, swelling, or penile discharge. Denies hesitancy or dribbling.   Also would like labs for biometric screen for his insurance since this was not done by his employer due to covid as it normally is done.   No Known Allergies   Current Outpatient Medications:  .  EPINEPHrine (EPIPEN 2-PAK) 0.3 mg/0.3 mL IJ SOAJ injection, Inject 0.3 mLs (0.3 mg total) into the muscle once., Disp: 1 Device, Rfl: 1 .  Aspirin-Caffeine 845-65 MG PACK, Take 1 packet by mouth daily., Disp: , Rfl:  .  fluocinonide cream (LIDEX) 7.16 %, Apply 1 application topically 3 (three) times daily. To eczema rash (Patient not taking: Reported on 01/06/2019), Disp: 60 g, Rfl: 1  Review of Systems  Constitutional: Negative.   Respiratory: Negative.   Cardiovascular: Negative.   Gastrointestinal: Negative.  Negative for rectal pain.  Genitourinary: Positive for frequency. Negative for discharge, penile pain, penile swelling, scrotal swelling and testicular pain.  Musculoskeletal: Negative for back pain.    Social History   Tobacco Use  . Smoking status: Never Smoker  . Smokeless tobacco: Never  Used  Substance Use Topics  . Alcohol use: Yes    Alcohol/week: 0.0 standard drinks    Comment: ocassionaly      Objective:   Wt 210 lb (95.3 kg)   BMI 27.71 kg/m  Vitals:   01/06/19 1503  Weight: 210 lb (95.3 kg)     Physical Exam Vitals signs reviewed.  Constitutional:      General: He is not in acute distress.    Appearance: He is well-developed. He is not ill-appearing.  HENT:     Head: Normocephalic and atraumatic.  Eyes:     Conjunctiva/sclera: Conjunctivae normal.  Neck:     Musculoskeletal: Normal range of motion and neck supple.  Pulmonary:     Effort: Pulmonary effort is normal. No respiratory distress.  Neurological:     Mental Status: He is alert.  Psychiatric:        Mood and Affect: Mood normal.        Behavior: Behavior normal.        Thought Content: Thought content normal.        Judgment: Judgment normal.      No results found for any visits on 01/06/19.     Assessment & Plan     1. Frequent urination Will check labs as below and f/u pending results. Discussed Tamsulosin to treat symptoms of frequency and nocturia, but patient declines at this time until lab results are back.  - PSA - Basic Metabolic Panel (BMET)  2. Encounter for biometric screening Will check  labs as below and f/u pending results. - Lipid Profile - HgB A1c - CBC w/Diff/Platelet   I discussed the assessment and treatment plan with the patient. The patient was provided an opportunity to ask questions and all were answered. The patient agreed with the plan and demonstrated an understanding of the instructions.   The patient was advised to call back or seek an in-person evaluation if the symptoms worsen or if the condition fails to improve as anticipated.  I provided 14 minutes of non-face-to-face time during this encounter.    Margaretann LovelessJennifer M , PA-C  [facility]

## 2019-01-06 NOTE — Progress Notes (Signed)
Based on what you shared with me, I feel your condition warrants further evaluation and I recommend that you be seen for a face to face office visit.  Male bladder infections are not very common.  We worry about prostate or kidney conditions.  The standard of care is to examine the abdomen and kidneys, and to do a urine and blood test to make sure that something more serious is not going on.  We recommend that you see a provider today.  If your doctor's office is closed [facility] has the following Urgent Cares:    NOTE: If you entered your credit card information for this eVisit, you will not be charged. You may see a "hold" on your card for the $35 but that hold will drop off and you will not have a charge processed.   If you are having a true medical emergency please call 911.     For an urgent face to face visit, [facility] has five urgent care centers for your convenience:     [email or web address] to reserve your spot online an avoid wait times   [facility] (New Address!) [address], [address] [place], [place] [address] *Just off [address], across the road from [employer]* Modified hours of operation: Monday-Friday, 12 PM to 6 PM  Closed Saturday & Sunday   The following sites will take your insurance:  . [facility]    [phone]                  Get Driving Directions  [address] [place], [place] [address] . 10 am to 8 pm Monday-Friday . 12 pm to 8 pm Saturday-Sunday   . [facility]  [phone]                  Get Driving Directions  [address], [address] , [place] [address] . 8 am to 8 pm Monday-Friday . 9 am to 6 pm Saturday . 11 am to 6 pm Sunday   . [facility] [place]  [phone]                  Get Driving Directions   [address] [place], [place] [address] . 8 am to 8 pm Monday-Friday . 8 am  to 4 pm Saturday-Sunday    .  [facility] [place]                    Get Driving Directions  [phone]  [address] [place],  [address]  . Monday-Friday, 12 PM to 6 PM    Your e-visit answers were reviewed by a board certified advanced clinical practitioner to complete your personal care plan.  Thank you for using e-Visits. 

## 2019-01-08 ENCOUNTER — Telehealth: Payer: Self-pay

## 2019-01-08 LAB — CBC WITH DIFFERENTIAL/PLATELET
Basophils Absolute: 0.1 10*3/uL (ref 0.0–0.2)
Basos: 2 %
EOS (ABSOLUTE): 0.3 10*3/uL (ref 0.0–0.4)
Eos: 7 %
Hematocrit: 44.4 % (ref 37.5–51.0)
Hemoglobin: 14.4 g/dL (ref 13.0–17.7)
Immature Grans (Abs): 0 10*3/uL (ref 0.0–0.1)
Immature Granulocytes: 0 %
Lymphocytes Absolute: 1.6 10*3/uL (ref 0.7–3.1)
Lymphs: 34 %
MCH: 29.8 pg (ref 26.6–33.0)
MCHC: 32.4 g/dL (ref 31.5–35.7)
MCV: 92 fL (ref 79–97)
Monocytes Absolute: 0.3 10*3/uL (ref 0.1–0.9)
Monocytes: 7 %
Neutrophils Absolute: 2.3 10*3/uL (ref 1.4–7.0)
Neutrophils: 50 %
Platelets: 234 10*3/uL (ref 150–450)
RBC: 4.83 x10E6/uL (ref 4.14–5.80)
RDW: 12.6 % (ref 11.6–15.4)
WBC: 4.6 10*3/uL (ref 3.4–10.8)

## 2019-01-08 LAB — BASIC METABOLIC PANEL
BUN/Creatinine Ratio: 16 (ref 9–20)
BUN: 15 mg/dL (ref 6–24)
CO2: 26 mmol/L (ref 20–29)
Calcium: 9.4 mg/dL (ref 8.7–10.2)
Chloride: 100 mmol/L (ref 96–106)
Creatinine, Ser: 0.91 mg/dL (ref 0.76–1.27)
GFR calc Af Amer: 108 mL/min/{1.73_m2} (ref >59)
GFR calc non Af Amer: 93 mL/min/{1.73_m2} (ref >59)
Glucose: 89 mg/dL (ref 65–99)
Potassium: 4.4 mmol/L (ref 3.5–5.2)
Sodium: 139 mmol/L (ref 134–144)

## 2019-01-08 LAB — PSA: Prostate Specific Ag, Serum: 2.3 ng/mL (ref 0.0–4.0)

## 2019-01-08 LAB — LIPID PANEL
Chol/HDL Ratio: 3 ratio (ref 0.0–5.0)
Cholesterol, Total: 192 mg/dL (ref 100–199)
HDL: 65 mg/dL (ref >39)
LDL Calculated: 109 mg/dL — ABNORMAL HIGH (ref 0–99)
Triglycerides: 92 mg/dL (ref 0–149)
VLDL Cholesterol Cal: 18 mg/dL (ref 5–40)

## 2019-01-08 LAB — HEMOGLOBIN A1C
Est. average glucose Bld gHb Est-mCnc: 117 mg/dL
Hgb A1c MFr Bld: 5.7 % — ABNORMAL HIGH (ref 4.8–5.6)

## 2019-01-08 NOTE — Telephone Encounter (Signed)
-----   Message from Mar Daring, PA-C sent at 01/08/2019  8:45 AM EDT ----- PSA is normal. Kidney and liver function normal. Cholesterol stable. A1c (3 month sugar) is borderline high at 5.7. Blood count is normal.

## 2019-01-08 NOTE — Telephone Encounter (Signed)
Patient advised as directed below. 

## 2019-12-31 ENCOUNTER — Ambulatory Visit (INDEPENDENT_AMBULATORY_CARE_PROVIDER_SITE_OTHER): Payer: 59 | Admitting: Physician Assistant

## 2019-12-31 ENCOUNTER — Other Ambulatory Visit: Payer: Self-pay

## 2019-12-31 ENCOUNTER — Encounter: Payer: Self-pay | Admitting: Physician Assistant

## 2019-12-31 VITALS — BP 107/70 | HR 65 | Temp 98.4°F | Ht 73.0 in | Wt 212.4 lb

## 2019-12-31 DIAGNOSIS — N401 Enlarged prostate with lower urinary tract symptoms: Secondary | ICD-10-CM | POA: Insufficient documentation

## 2019-12-31 DIAGNOSIS — R351 Nocturia: Secondary | ICD-10-CM | POA: Diagnosis not present

## 2019-12-31 DIAGNOSIS — Z130 Encounter for screening for diseases of the blood and blood-forming organs and certain disorders involving the immune mechanism: Secondary | ICD-10-CM

## 2019-12-31 DIAGNOSIS — Z114 Encounter for screening for human immunodeficiency virus [HIV]: Secondary | ICD-10-CM

## 2019-12-31 DIAGNOSIS — Z1329 Encounter for screening for other suspected endocrine disorder: Secondary | ICD-10-CM

## 2019-12-31 DIAGNOSIS — Z13228 Encounter for screening for other metabolic disorders: Secondary | ICD-10-CM

## 2019-12-31 DIAGNOSIS — Z136 Encounter for screening for cardiovascular disorders: Secondary | ICD-10-CM

## 2019-12-31 DIAGNOSIS — R1310 Dysphagia, unspecified: Secondary | ICD-10-CM | POA: Insufficient documentation

## 2019-12-31 DIAGNOSIS — Z125 Encounter for screening for malignant neoplasm of prostate: Secondary | ICD-10-CM

## 2019-12-31 DIAGNOSIS — Z0000 Encounter for general adult medical examination without abnormal findings: Secondary | ICD-10-CM

## 2019-12-31 DIAGNOSIS — Z6828 Body mass index (BMI) 28.0-28.9, adult: Secondary | ICD-10-CM

## 2019-12-31 MED ORDER — TAMSULOSIN HCL 0.4 MG PO CAPS: 0.4000 mg | ORAL_CAPSULE | Freq: Every day | ORAL | 1 refills | Status: DC

## 2019-12-31 NOTE — Progress Notes (Signed)
Complete physical exam   Patient: Jonathan Gallegos   DOB: 05/20/1961   59 y.o. Male  MRN: [id number] Visit Date: 12/31/2019  Today's healthcare provider: Margaretann Loveless, PA-C   Chief Complaint  Patient presents with  . Annual Exam   Subjective    Jonathan Gallegos is a 59 y.o. male who presents today for a complete physical exam.  He reports consuming a general diet. Home exercise routine includes walking. He generally feels fairly well. He reports sleeping well. He does have additional problems to discuss today.  HPI  Waist measurement is 39.5 inches  History reviewed. No pertinent past medical history. Past Surgical History:  Procedure Laterality Date  . COLONOSCOPY WITH PROPOFOL N/A 10/05/2016   Procedure: COLONOSCOPY WITH PROPOFOL;  Surgeon: Wyline Mood, MD;  Location: [facility] ENDOSCOPY;  Service: Endoscopy;  Laterality: N/A;  . KNEE SURGERY Right 2013   Social History   Socioeconomic History  . Marital status: Married    Spouse name: Not on file  . Number of children: 2  . Years of education: Not on file  . Highest education level: Not on file  Occupational History  . Not on file  Tobacco Use  . Smoking status: Never Smoker  . Smokeless tobacco: Never Used  Substance and Sexual Activity  . Alcohol use: Yes    Alcohol/week: 0.0 standard drinks    Comment: ocassionaly  . Drug use: No  . Sexual activity: Not on file  Other Topics Concern  . Not on file  Social History Narrative  . Not on file   Social Determinants of Health   Financial Resource Strain:   . Difficulty of Paying Living Expenses:   Food Insecurity:   . Worried About Programme researcher, broadcasting/film/video in the Last Year:   . Barista in the Last Year:   Transportation Needs:   . Freight forwarder (Medical):   Marland Kitchen Lack of Transportation (Non-Medical):   Physical Activity:   . Days of Exercise per Week:   . Minutes of Exercise per Session:   Stress:   . Feeling of Stress :   Social  Connections:   . Frequency of Communication with Friends and Family:   . Frequency of Social Gatherings with Friends and Family:   . Attends Religious Services:   . Active Member of Clubs or Organizations:   . Attends Banker Meetings:   Marland Kitchen Marital Status:   Intimate Partner Violence:   . Fear of Current or Ex-Partner:   . Emotionally Abused:   Marland Kitchen Physically Abused:   . Sexually Abused:    Family Status  Relation Name Status  . Mother  Deceased  . Father  Deceased  . PGF  Deceased at age 62  . Brother  Alive  . Daughter  Alive  . MGF  Deceased       OLD AGE  . Brother  Alive  . Daughter  Alive  . MGM  Deceased       cause of death: Pneumonia  . PGM  Deceased       old age   Family History  Problem Relation Age of Onset  . Bipolar disorder Mother   . Hypertension Father   . Cancer Father   . Colon polyps Father   . Leukemia Paternal Grandfather    No Known Allergies  Patient Care Team: Reine Just as PCP - General (Family Medicine)   Medications: Outpatient Medications  Prior to Visit  Medication Sig  . EPINEPHrine (EPIPEN 2-PAK) 0.3 mg/0.3 mL IJ SOAJ injection Inject 0.3 mLs (0.3 mg total) into the muscle once.  . [DISCONTINUED] Aspirin-Caffeine 845-65 MG PACK Take 1 packet by mouth daily.  . [DISCONTINUED] fluocinonide cream (LIDEX) 0.05 % Apply 1 application topically 3 (three) times daily. To eczema rash (Patient not taking: Reported on 01/06/2019)   No facility-administered medications prior to visit.    Review of Systems  Constitutional: Negative.   HENT: Negative.   Eyes: Negative.   Respiratory: Negative.   Cardiovascular: Negative.   Gastrointestinal: Negative.   Endocrine: Negative.   Genitourinary: Negative.   Musculoskeletal: Negative.   Skin: Negative.   Allergic/Immunologic: Negative.   Neurological: Negative.   Hematological: Negative.   Psychiatric/Behavioral: Negative.     Last CBC Lab Results  Component  Value Date   WBC 4.6 01/07/2019   HGB 14.4 01/07/2019   HCT 44.4 01/07/2019   MCV 92 01/07/2019   MCH 29.8 01/07/2019   RDW 12.6 01/07/2019   PLT 234 01/07/2019   Last metabolic panel Lab Results  Component Value Date   GLUCOSE 89 01/07/2019   NA 139 01/07/2019   K 4.4 01/07/2019   CL 100 01/07/2019   CO2 26 01/07/2019   BUN 15 01/07/2019   CREATININE 0.91 01/07/2019   GFRNONAA 93 01/07/2019   GFRAA 108 01/07/2019   CALCIUM 9.4 01/07/2019   PROT 7.0 07/30/2017   ALBUMIN 4.3 07/30/2017   LABGLOB 2.7 07/30/2017   AGRATIO 1.6 07/30/2017   BILITOT 0.5 07/30/2017   ALKPHOS 60 07/30/2017   AST 15 07/30/2017   ALT 10 07/30/2017   ANIONGAP 6 10/31/2015      Objective    BP 107/70 (BP Location: Left Arm, Patient Position: Sitting, Cuff Size: Large)   Pulse 65   Temp 98.4 F (36.9 C) (Oral)   Ht 6\' 1"  (1.854 m)   Wt 212 lb 6.4 oz (96.3 kg)   BMI 28.02 kg/m  BP Readings from Last 3 Encounters:  12/31/19 107/70  07/29/17 110/68  10/05/16 111/76   Wt Readings from Last 3 Encounters:  12/31/19 212 lb 6.4 oz (96.3 kg)  01/06/19 210 lb (95.3 kg)  07/29/17 219 lb (99.3 kg)      Physical Exam   General Appearance:     Overweight male. Alert, cooperative, in no acute distress, appears stated age  Head:    Normocephalic, without obvious abnormality, atraumatic  Eyes:    PERRL, conjunctiva/corneas clear, EOM's intact, fundi    benign, both eyes       Ears:    Normal TM's and external ear canals, both ears  Nose:   Nares normal, septum midline, mucosa normal, no drainage   or sinus tenderness  Throat:   Lips, mucosa, and tongue normal; teeth and gums normal  Neck:   Supple, symmetrical, trachea midline, no adenopathy;       thyroid:  No enlargement/tenderness/nodules; no carotid   bruit or JVD  Back:     Symmetric, no curvature, ROM normal, no CVA tenderness  Lungs:     Clear to auscultation bilaterally, respirations unlabored  Chest wall:    No tenderness or  deformity  Heart:    Normal heart rate. Normal rhythm. No murmurs, rubs, or gallops.  S1 and S2 normal  Abdomen:     Soft, non-tender, bowel sounds active all four quadrants,    no masses, no organomegaly  Genitalia:    deferred  Rectal:  deferred  Extremities:   All extremities are intact. No cyanosis or edema  Pulses:   2+ and symmetric all extremities  Skin:   Skin color, texture, turgor normal, no rashes or lesions  Lymph nodes:   Cervical, supraclavicular, and axillary nodes normal  Neurologic:   CNII-XII intact. Normal strength, sensation and reflexes      throughout     Last depression screening scores PHQ 2/9 Scores 12/31/2019  PHQ - 2 Score 0   Last fall risk screening Fall Risk  12/31/2019  Falls in the past year? 0  Number falls in past yr: 0  Injury with Fall? 0   Last Audit-C alcohol use screening No flowsheet data found. A score of 3 or more in women, and 4 or more in men indicates increased risk for alcohol abuse, EXCEPT if all of the points are from question 1   No results found for any visits on 12/31/19.  Assessment & Plan    Routine Health Maintenance and Physical Exam  Exercise Activities and Dietary recommendations Goals   None     Immunization History  Administered Date(s) Administered  . Influenza-Unspecified 04/12/2017  . Tdap 08/10/2016    Health Maintenance  Topic Date Due  . COVID-19 Vaccine (1) Never done  . HIV Screening  Never done  . INFLUENZA VACCINE  12/27/2019  . COLONOSCOPY  10/05/2021  . TETANUS/TDAP  08/11/2026  . Hepatitis C Screening  Completed    Discussed health benefits of physical activity, and encouraged him to engage in regular exercise appropriate for his age and condition.  1. Annual physical exam Normal physical exam today. Will check labs as below and f/u pending lab results. If labs are stable and WNL he will not need to have these rechecked for one year at his next annual physical exam. He is to call the  office in the meantime if he has any acute issue, questions or concerns.  2. Encounter for special screening examination for cardiovascular disorder Will check labs as below and f/u pending results. - Lipid panel  3. Encounter for screening for hematologic disorder Will check labs as below and f/u pending results. - CBC  4. Encounter for screening for HIV Will check labs as below and f/u pending results. - HIV Antibody (routine testing w rflx)  5. Screening for metabolic disorder Will check labs as below and f/u pending results. - Comprehensive metabolic panel - HgB A1c  6. Prostate cancer screening Will check labs as below and f/u pending results. - PSA  7. Screening for thyroid disorder Will check labs as below and f/u pending results. - TSH  8. BMI 28.0-28.9,adult Counseled patient on healthy lifestyle modifications including dieting and exercise. Will check labs as below and f/u pending results. - Comprehensive metabolic panel - Lipid panel - HgB A1c  9. BPH associated with nocturia Having increase in nighttime awakenings. Will await PSA result. Add tamsulosin as below. F/U if not helping and will refer to Urology. Patient in agreement.  - tamsulosin (FLOMAX) 0.4 MG CAPS capsule; Take 1 capsule (0.4 mg total) by mouth daily after supper.  Dispense: 90 capsule; Refill: 1  10. Dysphagia, unspecified type Worsening. Does have h/o esophageal stretching and was told he had lots of scar tissue. Discussed referral but he wishes to wait at this time and will refer if symptoms continue to worsen.   No follow-ups on file.     Delmer Islam, PA-C, have reviewed all documentation for this visit.  The documentation on 01/03/20 for the exam, diagnosis, procedures, and orders are all accurate and complete.   Reine JustJennifer M Jeff Mccallum, PA-C  [facility] [phone] (phone) [phone] (fax)  [facility]

## 2019-12-31 NOTE — Patient Instructions (Signed)
Health Maintenance, Male Adopting a healthy lifestyle and getting preventive care are important in promoting health and wellness. Ask your health care provider about:  The right schedule for you to have regular tests and exams.  Things you can do on your own to prevent diseases and keep yourself healthy. What should I know about diet, weight, and exercise? Eat a healthy diet   Eat a diet that includes plenty of vegetables, fruits, low-fat dairy products, and lean protein.  Do not eat a lot of foods that are high in solid fats, added sugars, or sodium. Maintain a healthy weight Body mass index (BMI) is a measurement that can be used to identify possible weight problems. It estimates body fat based on height and weight. Your health care provider can help determine your BMI and help you achieve or maintain a healthy weight. Get regular exercise Get regular exercise. This is one of the most important things you can do for your health. Most adults should:  Exercise for at least 150 minutes each week. The exercise should increase your heart rate and make you sweat (moderate-intensity exercise).  Do strengthening exercises at least twice a week. This is in addition to the moderate-intensity exercise.  Spend less time sitting. Even light physical activity can be beneficial. Watch cholesterol and blood lipids Have your blood tested for lipids and cholesterol at 58 years of age, then have this test every 5 years. You may need to have your cholesterol levels checked more often if:  Your lipid or cholesterol levels are high.  You are older than 58 years of age.  You are at high risk for heart disease. What should I know about cancer screening? Many types of cancers can be detected early and may often be prevented. Depending on your health history and family history, you may need to have cancer screening at various ages. This may include screening for:  Colorectal cancer.  Prostate  cancer.  Skin cancer.  Lung cancer. What should I know about heart disease, diabetes, and high blood pressure? Blood pressure and heart disease  High blood pressure causes heart disease and increases the risk of stroke. This is more likely to develop in people who have high blood pressure readings, are of African descent, or are overweight.  Talk with your health care provider about your target blood pressure readings.  Have your blood pressure checked: ? Every 3-5 years if you are 58-46 years of age. ? Every year if you are 58 years old or older.  If you are between the ages of 69 and 47 and are a current or former smoker, ask your health care provider if you should have a one-time screening for abdominal aortic aneurysm (AAA). Diabetes Have regular diabetes screenings. This checks your fasting blood sugar level. Have the screening done:  Once every three years after age 60 if you are at a normal weight and have a low risk for diabetes.  More often and at a younger age if you are overweight or have a high risk for diabetes. What should I know about preventing infection? Hepatitis B If you have a higher risk for hepatitis B, you should be screened for this virus. Talk with your health care provider to find out if you are at risk for hepatitis B infection. Hepatitis C Blood testing is recommended for:  Everyone born from [birth year] through 1964.  Anyone with known risk factors for hepatitis C. Sexually transmitted infections (STIs)  You should be screened each year  for STIs, including gonorrhea and chlamydia, if: ? You are sexually active and are younger than 58 years of age. ? You are older than 58 years of age and your health care provider tells you that you are at risk for this type of infection. ? Your sexual activity has changed since you were last screened, and you are at increased risk for chlamydia or gonorrhea. Ask your health care provider if you are at risk.  Ask your  health care provider about whether you are at high risk for HIV. Your health care provider may recommend a prescription medicine to help prevent HIV infection. If you choose to take medicine to prevent HIV, you should first get tested for HIV. You should then be tested every 3 months for as long as you are taking the medicine. Follow these instructions at home: Lifestyle  Do not use any products that contain nicotine or tobacco, such as cigarettes, e-cigarettes, and chewing tobacco. If you need help quitting, ask your health care provider.  Do not use street drugs.  Do not share needles.  Ask your health care provider for help if you need support or information about quitting drugs. Alcohol use  Do not drink alcohol if your health care provider tells you not to drink.  If you drink alcohol: ? Limit how much you have to 0-2 drinks a day. ? Be aware of how much alcohol is in your drink. In the [place] one drink equals one 12 oz bottle of beer (355 mL), one 5 oz glass of wine (148 mL), or one 1 oz glass of hard liquor (44 mL). General instructions  Schedule regular health, dental, and eye exams.  Stay current with your vaccines.  Tell your health care provider if: ? You often feel depressed. ? You have ever been abused or do not feel safe at home. Summary  Adopting a healthy lifestyle and getting preventive care are important in promoting health and wellness.  Follow your health care provider's instructions about healthy diet, exercising, and getting tested or screened for diseases.  Follow your health care provider's instructions on monitoring your cholesterol and blood pressure. This information is not intended to replace advice given to you by your health care provider. Make sure you discuss any questions you have with your health care provider. Document Revised: 05/07/2018 Document Reviewed: 05/07/2018 Elsevier Patient Education  2020 Elsevier Inc.  Tamsulosin capsules What is  this medicine? TAMSULOSIN (tam SOO loe sin) is an alpha blocker. It is used to treat the signs and symptoms of an enlarged prostate in men. This condition is also called benign prostatic hyperplasia (BPH). This medicine may be used for other purposes; ask your health care provider or pharmacist if you have questions. COMMON BRAND NAME(S): Flomax What should I tell my health care provider before I take this medicine? They need to know if you have any of the following conditions:  advanced kidney disease  advanced liver disease  low blood pressure  prostate cancer  an unusual or allergic reaction to tamsulosin, sulfa drugs, other medicines, foods, dyes, or preservatives  pregnant or trying to get pregnant  breast-feeding How should I use this medicine? Take this medicine by mouth about 30 minutes after the same meal every day. Follow the directions on the prescription label. Swallow the capsules whole with a glass of water. Do not crush, chew, or open capsules. Do not take your medicine more often than directed. Do not stop taking your medicine unless your doctor  tells you to. Talk to your pediatrician regarding the use of this medicine in children. Special care may be needed. Overdosage: If you think you have taken too much of this medicine contact a poison control center or emergency room at once. NOTE: This medicine is only for you. Do not share this medicine with others. What if I miss a dose? If you miss a dose, take it as soon as you can. If it is almost time for your next dose, take only that dose. Do not take double or extra doses. If you stop taking your medicine for several days or more, ask your doctor or health care professional what dose you should start back on. What may interact with this medicine?  cimetidine  fluoxetine  ketoconazole  medicines for erectile disfunction like sildenafil, tadalafil, vardenafil  medicines for high blood pressure  other alpha-blockers  like alfuzosin, doxazosin, phentolamine, phenoxybenzamine, prazosin, terazosin  warfarin This list may not describe all possible interactions. Give your health care provider a list of all the medicines, herbs, non-prescription drugs, or dietary supplements you use. Also tell them if you smoke, drink alcohol, or use illegal drugs. Some items may interact with your medicine. What should I watch for while using this medicine? Visit your doctor or health care professional for regular check ups. You will need lab work done before you start this medicine and regularly while you are taking it. Check your blood pressure as directed. Ask your health care professional what your blood pressure should be, and when you should contact him or her. This medicine may make you feel dizzy or lightheaded. This is more likely to happen after the first dose, after an increase in dose, or during hot weather or exercise. Drinking alcohol and taking some medicines can make this worse. Do not drive, use machinery, or do anything that needs mental alertness until you know how this medicine affects you. Do not sit or stand up quickly. If you begin to feel dizzy, sit down until you feel better. These effects can decrease once your body adjusts to the medicine. Contact your doctor or health care professional right away if you have an erection that lasts longer than 4 hours or if it becomes painful. This may be a sign of a serious problem and must be treated right away to prevent permanent damage. If you are thinking of having cataract surgery, tell your eye surgeon that you have taken this medicine. What side effects may I notice from receiving this medicine? Side effects that you should report to your doctor or health care professional as soon as possible:  allergic reactions like skin rash or itching, hives, swelling of the lips, mouth, tongue, or throat  breathing problems  change in vision  feeling faint or  lightheaded  irregular heartbeat  prolonged or painful erection  weakness Side effects that usually do not require medical attention (report to your doctor or health care professional if they continue or are bothersome):  back pain  change in sex drive or performance  constipation, nausea or vomiting  cough  drowsy  runny or stuffy nose  trouble sleeping This list may not describe all possible side effects. Call your doctor for medical advice about side effects. You may report side effects to FDA at 1-800-FDA-1088. Where should I keep my medicine? Keep out of the reach of children. Store at room temperature between 15 and 30 degrees C (59 and 86 degrees F). Throw away any unused medicine after the expiration  date. NOTE: This sheet is a summary. It may not cover all possible information. If you have questions about this medicine, talk to your doctor, pharmacist, or health care provider.  2020 Elsevier/Gold Standard (2017-10-17 12:54:06)

## 2020-01-06 LAB — CBC
Hematocrit: 41.5 % (ref 37.5–51.0)
Hemoglobin: 13.8 g/dL (ref 13.0–17.7)
MCH: 30.5 pg (ref 26.6–33.0)
MCHC: 33.3 g/dL (ref 31.5–35.7)
MCV: 92 fL (ref 79–97)
Platelets: 220 10*3/uL (ref 150–450)
RBC: 4.52 x10E6/uL (ref 4.14–5.80)
RDW: 12.7 % (ref 11.6–15.4)
WBC: 4.1 10*3/uL (ref 3.4–10.8)

## 2020-01-06 LAB — PSA: Prostate Specific Ag, Serum: 1.4 ng/mL (ref 0.0–4.0)

## 2020-01-06 LAB — COMPREHENSIVE METABOLIC PANEL
ALT: 10 IU/L (ref 0–44)
AST: 17 IU/L (ref 0–40)
Albumin/Globulin Ratio: 1.7 (ref 1.2–2.2)
Albumin: 4.3 g/dL (ref 3.8–4.9)
Alkaline Phosphatase: 46 IU/L — ABNORMAL LOW (ref 48–121)
BUN/Creatinine Ratio: 17 (ref 9–20)
BUN: 15 mg/dL (ref 6–24)
Bilirubin Total: 0.5 mg/dL (ref 0.0–1.2)
CO2: 25 mmol/L (ref 20–29)
Calcium: 9.1 mg/dL (ref 8.7–10.2)
Chloride: 101 mmol/L (ref 96–106)
Creatinine, Ser: 0.89 mg/dL (ref 0.76–1.27)
GFR calc Af Amer: 109 mL/min/{1.73_m2} (ref >59)
GFR calc non Af Amer: 94 mL/min/{1.73_m2} (ref >59)
Globulin, Total: 2.5 g/dL (ref 1.5–4.5)
Glucose: 92 mg/dL (ref 65–99)
Potassium: 4.1 mmol/L (ref 3.5–5.2)
Sodium: 138 mmol/L (ref 134–144)
Total Protein: 6.8 g/dL (ref 6.0–8.5)

## 2020-01-06 LAB — LIPID PANEL
Chol/HDL Ratio: 3 ratio (ref 0.0–5.0)
Cholesterol, Total: 197 mg/dL (ref 100–199)
HDL: 66 mg/dL (ref >39)
LDL Chol Calc (NIH): 115 mg/dL — ABNORMAL HIGH (ref 0–99)
Triglycerides: 87 mg/dL (ref 0–149)
VLDL Cholesterol Cal: 16 mg/dL (ref 5–40)

## 2020-01-06 LAB — TSH: TSH: 2.16 u[IU]/mL (ref 0.450–4.500)

## 2020-01-06 LAB — HEMOGLOBIN A1C
Est. average glucose Bld gHb Est-mCnc: 117 mg/dL
Hgb A1c MFr Bld: 5.7 % — ABNORMAL HIGH (ref 4.8–5.6)

## 2020-01-06 LAB — HIV ANTIBODY (ROUTINE TESTING W REFLEX): HIV Screen 4th Generation wRfx: NONREACTIVE

## 2020-01-17 ENCOUNTER — Encounter: Payer: Self-pay | Admitting: Physician Assistant

## 2020-01-17 DIAGNOSIS — J40 Bronchitis, not specified as acute or chronic: Secondary | ICD-10-CM

## 2020-01-18 ENCOUNTER — Encounter: Payer: Self-pay | Admitting: Physician Assistant

## 2020-01-18 ENCOUNTER — Telehealth (INDEPENDENT_AMBULATORY_CARE_PROVIDER_SITE_OTHER): Payer: 59 | Admitting: Physician Assistant

## 2020-01-18 DIAGNOSIS — R059 Cough, unspecified: Secondary | ICD-10-CM

## 2020-01-18 DIAGNOSIS — R05 Cough: Secondary | ICD-10-CM

## 2020-01-18 DIAGNOSIS — R0981 Nasal congestion: Secondary | ICD-10-CM | POA: Diagnosis not present

## 2020-01-18 DIAGNOSIS — J029 Acute pharyngitis, unspecified: Secondary | ICD-10-CM | POA: Diagnosis not present

## 2020-01-18 NOTE — Progress Notes (Signed)
MyChart Video Visit    Virtual Visit via Video Note   This visit type was conducted due to national recommendations for restrictions regarding the COVID-19 Pandemic (e.g. social distancing) in an effort to limit this patient's exposure and mitigate transmission in our community. This patient is at least at moderate risk for complications without adequate follow up. This format is felt to be most appropriate for this patient at this time. Physical exam was limited by quality of the video and audio technology used for the visit.   I connected with Jonathan Gallegos on 01/18/20 at  6:20 PM EDT by a video enabled telemedicine application and verified that I am speaking with the correct person using two identifiers.  I discussed the limitations of evaluation and management by telemedicine and the availability of in person appointments. The patient expressed understanding and agreed to proceed.  Patient location: Home Provider location: [facility]   Patient: Jonathan Gallegos   DOB: February 09, 1961   59 y.o. Male  MRN: [id number] Visit Date: 01/18/2020  Today's healthcare provider: Margaretann Loveless, PA-C   Chief Complaint  Patient presents with  . Sore Throat   Subjective    Sore Throat  This is a new problem. The current episode started in the past 7 days (Wednesday evening). Maximum temperature: 99.5  Associated symptoms include congestion ("chest congestion"), coughing (in the last 2 nights), ear pain ("pressure last night"), headaches (on Thursday) and shortness of breath. Pertinent negatives include no abdominal pain, diarrhea, ear discharge, trouble swallowing or vomiting. Treatments tried: Mucinex DM today and was using Mucinex -sinus max. The treatment provided no relief (He is schedule for COVID testing tomorrow.).     Patient Active Problem List   Diagnosis Date Noted  . BPH associated with nocturia 12/31/2019  . Dysphagia 12/31/2019  . Eczema 07/29/2017  . Varicose veins of  right leg with edema 11/02/2015   History reviewed. No pertinent past medical history.    Medications: Outpatient Medications Prior to Visit  Medication Sig  . EPINEPHrine (EPIPEN 2-PAK) 0.3 mg/0.3 mL IJ SOAJ injection Inject 0.3 mLs (0.3 mg total) into the muscle once.  . tamsulosin (FLOMAX) 0.4 MG CAPS capsule Take 1 capsule (0.4 mg total) by mouth daily after supper.   No facility-administered medications prior to visit.    Review of Systems  Constitutional: Positive for fever (possible low grade, not measured). Negative for chills.  HENT: Positive for congestion ("chest congestion"), ear pain ("pressure last night"), postnasal drip, sinus pressure, sinus pain, sneezing and sore throat ("better"). Negative for ear discharge and trouble swallowing.   Respiratory: Positive for cough (in the last 2 nights), chest tightness, shortness of breath and wheezing.   Cardiovascular: Negative for chest pain, palpitations and leg swelling.  Gastrointestinal: Negative for abdominal pain, diarrhea and vomiting.  Neurological: Positive for dizziness (" a little") and headaches (on Thursday).    Last CBC Lab Results  Component Value Date   WBC 4.1 01/05/2020   HGB 13.8 01/05/2020   HCT 41.5 01/05/2020   MCV 92 01/05/2020   MCH 30.5 01/05/2020   RDW 12.7 01/05/2020   PLT 220 01/05/2020   Last metabolic panel Lab Results  Component Value Date   GLUCOSE 92 01/05/2020   NA 138 01/05/2020   K 4.1 01/05/2020   CL 101 01/05/2020   CO2 25 01/05/2020   BUN 15 01/05/2020   CREATININE 0.89 01/05/2020   GFRNONAA 94 01/05/2020   GFRAA 109 01/05/2020   CALCIUM  9.1 01/05/2020   PROT 6.8 01/05/2020   ALBUMIN 4.3 01/05/2020   LABGLOB 2.5 01/05/2020   AGRATIO 1.7 01/05/2020   BILITOT 0.5 01/05/2020   ALKPHOS 46 (L) 01/05/2020   AST 17 01/05/2020   ALT 10 01/05/2020   ANIONGAP 6 10/31/2015      Objective    There were no vitals taken for this visit. BP Readings from Last 3 Encounters:    12/31/19 107/70  07/29/17 110/68  10/05/16 111/76   Wt Readings from Last 3 Encounters:  12/31/19 212 lb 6.4 oz (96.3 kg)  01/06/19 210 lb (95.3 kg)  07/29/17 219 lb (99.3 kg)      Physical Exam Vitals reviewed.  Constitutional:      General: He is not in acute distress.    Appearance: He is well-developed.  HENT:     Head: Normocephalic and atraumatic.  Eyes:     Conjunctiva/sclera: Conjunctivae normal.  Pulmonary:     Effort: Pulmonary effort is normal. No respiratory distress.  Musculoskeletal:     Cervical back: Normal range of motion and neck supple.  Neurological:     Mental Status: He is alert.  Psychiatric:        Behavior: Behavior normal.        Thought Content: Thought content normal.        Judgment: Judgment normal.        Assessment & Plan     1. Pharyngitis, unspecified etiology Patient is undergoing covid 19 testing tomorrow at 3pm. Continue symptomatic relief with OTC medication of choice (Mucinex DM). Continue salt water gargles, chloraseptic spray. Use tylenol prn for fevers and body aches. Push fluids. Call if worsening.   2. Cough Using mucinex dm. Not severe at this time. Not affecting sleep.   3. Sinus congestion May use flonase and/or claritin.   No follow-ups on file.     I discussed the assessment and treatment plan with the patient. The patient was provided an opportunity to ask questions and all were answered. The patient agreed with the plan and demonstrated an understanding of the instructions.   The patient was advised to call back or seek an in-person evaluation if the symptoms worsen or if the condition fails to improve as anticipated.  I provided 14 minutes of non-face-to-face time during this encounter.  Delmer Islam, PA-C, have reviewed all documentation for this visit. The documentation on 01/20/20 for the exam, diagnosis, procedures, and orders are all accurate and complete.  [facility] [phone] (phone) [phone] (fax)  [facility]

## 2020-01-19 ENCOUNTER — Telehealth: Payer: Self-pay | Admitting: Physician Assistant

## 2020-01-19 ENCOUNTER — Ambulatory Visit: Payer: Self-pay

## 2020-01-19 DIAGNOSIS — J0140 Acute pansinusitis, unspecified: Secondary | ICD-10-CM

## 2020-01-19 DIAGNOSIS — R059 Cough, unspecified: Secondary | ICD-10-CM

## 2020-01-19 MED ORDER — BENZONATATE 200 MG PO CAPS: 200.0000 mg | ORAL_CAPSULE | Freq: Three times a day (TID) | ORAL | 0 refills | Status: DC | PRN

## 2020-01-19 MED ORDER — AMOXICILLIN-POT CLAVULANATE 875-125 MG PO TABS: 1.0000 | ORAL_TABLET | Freq: Two times a day (BID) | ORAL | 0 refills | Status: DC

## 2020-01-19 NOTE — Telephone Encounter (Signed)
Pt called to let Victorino Dike know he decided he'd like to have the antibiotic sent into the pharmacy / please send to  [facility] [address], [place] - [address] Phone:  [phone]  Fax:  [phone]

## 2020-01-19 NOTE — Telephone Encounter (Signed)
LM that provider replied back to his mychart message and that also she sent in antibiotic and cough medicine to his pharmacy.

## 2020-01-19 NOTE — Telephone Encounter (Signed)
Sent in Augmentin and tessalon perles to [facility]

## 2020-01-19 NOTE — Telephone Encounter (Signed)
Summary: covid treatment questions    Pt would like to speak with nurse about covid treatment . Please advise     Pt. Reports his cough is non-productive and is having wheezing. Taking Mucinex DM. Could not sleep because of the cough. Request he be notified when antibiotic is sent to his pharmacy. Having COVID 19 test this afternoon.  Answer Assessment - Initial Assessment Questions 1. COVID-19 DIAGNOSIS: "Who made your Coronavirus (COVID-19) diagnosis?" "Was it confirmed by a positive lab test?" If not diagnosed by a HCP, ask "Are there lots of cases (community spread) where you live?" (See public health department website, if unsure)     No - this afternoo 2. COVID-19 EXPOSURE: "Was there any known exposure to COVID before the symptoms began?" CDC Definition of close contact: within 6 feet (2 meters) for a total of 15 minutes or more over a 24-hour period.      No 3. ONSET: "When did the COVID-19 symptoms start?"      Started last Wednesday 4. WORST SYMPTOM: "What is your worst symptom?" (e.g., cough, fever, shortness of breath, muscle aches)     Cough 5. COUGH: "Do you have a cough?" If Yes, ask: "How bad is the cough?"       Yes - Moderate 6. FEVER: "Do you have a fever?" If Yes, ask: "What is your temperature, how was it measured, and when did it start?"     Low grade 99 7. RESPIRATORY STATUS: "Describe your breathing?" (e.g., shortness of breath, wheezing, unable to speak)      Wheezing, some shortness of breath 8. BETTER-SAME-WORSE: "Are you getting better, staying the same or getting worse compared to yesterday?"  If getting worse, ask, "In what way?"     Same 9. HIGH RISK DISEASE: "Do you have any chronic medical problems?" (e.g., asthma, heart or lung disease, weak immune system, obesity, etc.)     No 10. PREGNANCY: "Is there any chance you are pregnant?" "When was your last menstrual period?"       n/a 11. OTHER SYMPTOMS: "Do you have any other symptoms?"  (e.g., chills, fatigue,  headache, loss of smell or taste, muscle pain, sore throat; new loss of smell or taste especially support the diagnosis of COVID-19)       Headache with coughing  Protocols used: CORONAVIRUS (COVID-19) DIAGNOSED OR SUSPECTED-A-AH

## 2020-01-20 ENCOUNTER — Encounter: Payer: Self-pay | Admitting: Physician Assistant

## 2020-01-25 ENCOUNTER — Telehealth: Payer: Self-pay | Admitting: Physician Assistant

## 2020-01-25 MED ORDER — ALBUTEROL SULFATE 108 (90 BASE) MCG/ACT IN AEPB: 1.0000 | INHALATION_SPRAY | RESPIRATORY_TRACT | 1 refills | Status: AC | PRN

## 2020-01-25 MED ORDER — PREDNISONE 10 MG PO TABS: ORAL_TABLET | ORAL | 0 refills | Status: DC

## 2020-01-25 NOTE — Telephone Encounter (Signed)
Albuterol inhaler sent to pharmacy.

## 2020-01-25 NOTE — Telephone Encounter (Signed)
Please advise 

## 2020-01-25 NOTE — Telephone Encounter (Signed)
Copied from CRM [id number]. Topic: General - Other >> Jan 25, 2020 10:48 AM Gwenlyn Fudge wrote: Reason for CRM: Pt called stating that even with the medication he was given, he is still experiencing a bad cough in his chest. Pt is requesting to have an inhaler sent in for him. Please advise.    [facility] [address], [place] - [address]  [address] [place] [address]  Phone: [phone] Fax: [phone]  Hours: Not open 24 hours

## 2020-01-25 NOTE — Addendum Note (Signed)
Addended by: Margaretann Loveless on: 01/25/2020 02:49 PM   Modules accepted: Orders

## 2020-03-10 ENCOUNTER — Other Ambulatory Visit: Payer: Self-pay

## 2020-03-10 ENCOUNTER — Encounter: Payer: Self-pay | Admitting: Physician Assistant

## 2020-03-10 ENCOUNTER — Ambulatory Visit (INDEPENDENT_AMBULATORY_CARE_PROVIDER_SITE_OTHER): Payer: 59 | Admitting: Physician Assistant

## 2020-03-10 VITALS — BP 103/66 | HR 77 | Temp 98.4°F | Resp 16 | Ht 73.0 in | Wt 217.0 lb

## 2020-03-10 DIAGNOSIS — R21 Rash and other nonspecific skin eruption: Secondary | ICD-10-CM

## 2020-03-10 DIAGNOSIS — Z23 Encounter for immunization: Secondary | ICD-10-CM | POA: Diagnosis not present

## 2020-03-10 MED ORDER — TRIAMCINOLONE ACETONIDE 0.1 % EX CREA: 1.0000 "application " | TOPICAL_CREAM | Freq: Two times a day (BID) | CUTANEOUS | 0 refills | Status: DC

## 2020-03-10 MED ORDER — VALACYCLOVIR HCL 1 G PO TABS: 1000.0000 mg | ORAL_TABLET | Freq: Three times a day (TID) | ORAL | 0 refills | Status: AC

## 2020-03-10 NOTE — Progress Notes (Signed)
Established patient visit   Patient: Jonathan Gallegos   DOB: 14-Jan-1961   59 y.o. Male  MRN: [id number] Visit Date: 03/10/2020  Today's healthcare provider: Trey Sailors, PA-C   Chief Complaint  Patient presents with  . Rash   Subjective    Rash This is a new problem. The current episode started in the past 7 days (4 days). The problem has been gradually worsening since onset. The affected locations include the neck. The rash is characterized by blistering, burning and pain. He was exposed to nothing. Associated symptoms include fatigue. Past treatments include nothing.    Patient feels that he may have shingles. He has not been vaccinated for shingles. Patient reports some burning before a rash and felt some heat as well.      Medications: Outpatient Medications Prior to Visit  Medication Sig  . Albuterol Sulfate (PROAIR RESPICLICK) 108 (90 Base) MCG/ACT AEPB Inhale 1-2 puffs into the lungs every 4 (four) hours as needed.  Marland Kitchen EPINEPHrine (EPIPEN 2-PAK) 0.3 mg/0.3 mL IJ SOAJ injection Inject 0.3 mLs (0.3 mg total) into the muscle once.  . tamsulosin (FLOMAX) 0.4 MG CAPS capsule Take 1 capsule (0.4 mg total) by mouth daily after supper.  Marland Kitchen amoxicillin-clavulanate (AUGMENTIN) 875-125 MG tablet Take 1 tablet by mouth 2 (two) times daily.  . benzonatate (TESSALON) 200 MG capsule Take 1 capsule (200 mg total) by mouth 3 (three) times daily as needed.  . predniSONE (DELTASONE) 10 MG tablet Take 6 tablets PO on day 1 and day 2, take 5 tablets PO on day 3 and day 4, take 4 tablets PO on day 5 and day 6, take 3 tablets PO on day 7 and day 8, take 2 tablets PO on day 9 and day 10, take one tablet PO on day 11 and day 12.   No facility-administered medications prior to visit.    Review of Systems  Constitutional: Positive for fatigue.  Skin: Positive for color change and rash.    {Heme  Chem  Endocrine  Serology  Results Review (optional):23779::" "}  Objective    BP  103/66   Pulse 77   Temp 98.4 F (36.9 C)   Resp 16   Ht 6\' 1"  (1.854 m)   Wt 217 lb (98.4 kg)   BMI 28.63 kg/m    Physical Exam Constitutional:      Appearance: Normal appearance.  Neck:      Comments: Several raise erythematous lesions on left side of neck, not in dermatomal pattern and not herpetic appearing.  Cardiovascular:     Rate and Rhythm: Normal rate and regular rhythm.  Pulmonary:     Effort: Pulmonary effort is normal. No respiratory distress.  Skin:    Findings: Erythema and rash present.  Neurological:     Mental Status: He is alert and oriented to person, place, and time. Mental status is at baseline.  Psychiatric:        Mood and Affect: Mood normal.        Behavior: Behavior normal.       No results found for any visits on 03/10/20.  Assessment & Plan    1. Rash  Does not appear consistent with shingles. Would try kenalog cream. However, counseled valtrex is a very benign medicine and if he has a concern for shingles he can take the medication. When rash resolves, recommend shingles vaccination.  - triamcinolone cream (KENALOG) 0.1 %; Apply 1 application topically 2 (two) times daily.  Dispense: 30 g; Refill: 0 - valACYclovir (VALTREX) 1000 MG tablet; Take 1 tablet (1,000 mg total) by mouth 3 (three) times daily for 7 days.  Dispense: 21 tablet; Refill: 0   Return if symptoms worsen or fail to improve.      ITrey Sailors, PA-C, have reviewed all documentation for this visit. The documentation on 03/10/20 for the exam, diagnosis, procedures, and orders are all accurate and complete.  The entirety of the information documented in the History of Present Illness, Review of Systems and Physical Exam were personally obtained by me. Portions of this information were initially documented by Anson [place], CMA and reviewed by me for thoroughness and accuracy.      Maryella Shivers  [facility] [phone]  (phone) [phone] (fax)  [facility]

## 2020-03-10 NOTE — Patient Instructions (Signed)
Call your insurance and see where SHing[employer] is paid for.

## 2020-03-14 ENCOUNTER — Encounter: Payer: Self-pay | Admitting: Physician Assistant

## 2020-07-19 ENCOUNTER — Other Ambulatory Visit: Payer: Self-pay | Admitting: Physician Assistant

## 2020-07-19 DIAGNOSIS — N401 Enlarged prostate with lower urinary tract symptoms: Secondary | ICD-10-CM

## 2020-08-01 ENCOUNTER — Ambulatory Visit: Payer: Self-pay | Admitting: *Deleted

## 2020-08-01 NOTE — Telephone Encounter (Signed)
I returned pt's call.   He has a long standing history with dysphagia and has had his esophagus stretched.   He was told he has scar tissue in esophagus.  Recently he has noticed over time it's getting more difficult to swallow dry foods but difficulty worsening overall with all foods. He asked if he was due a colonoscopy soon and if so he wanted to have an upper exam done too.    He said he had a colonoscopy done about 2-3 yrs ago.  I let him know they usually do a colonoscopy every 10 yrs unless there is an indication for it to be done more often.   I let him know I would pass this information on the [facility], PA-C.  I asked if he needed a referral to a GI dr for an endoscopy to have his esophagus stretched.   He wasn't sure so is going to call his insurance company and see what his deductible is and how much it would cover the endoscopy if he had it done now versus toward the end of the year when he has met his deductible and see if he needs a referral.     I asked if he wanted the same dr that did his colonoscopy 2-3 yrs ago to do the endoscopy.   He mentioned possibly going to [facility] to have it done by a GI dr a friend recommended to him.  He had the colonoscopy at [facility].  I triaged him for difficulty swallowing and he is not in distress or having acute issues at this time.  I did not schedule an appt per the protocol because he wants to call his insurance company first then call us back.  I sent my notes to [facility] for Cawood Burnette's information.    Reason for Disposition . Difficulty swallowing is a chronic symptom (recurrent or ongoing AND present > 4 weeks)  Answer Assessment - Initial Assessment Questions 1. SYMPTOM: "Are you having difficulty swallowing liquids, solids, or both?"     This has been ongoing for years.  Had esophagus stretching done. 2. ONSET: "When did the swallowing problems begin?"      Several years 3. CAUSE: "What do you think  is causing the problem?"      Need esophagus stretched.   I have scar tissue. 4. CHRONIC/RECURRENT: "Is this a new problem for you?"  If no, ask: "How long have you had this problem?" (e.g., days, weeks, months)      Chronic  For years. 5. OTHER SYMPTOMS: "Do you have any other symptoms?" (e.g., difficulty breathing, sore throat, swollen tongue, chest pain)     Mainly difficulty with swallowing dry foods but it's gradually getting worse over time with swallowing anything.  6. PREGNANCY: "Is there any chance you are pregnant?" "When was your last menstrual period?"     N/A  Protocols used: SWALLOWING DIFFICULTY-A-AH

## 2020-09-07 ENCOUNTER — Ambulatory Visit (INDEPENDENT_AMBULATORY_CARE_PROVIDER_SITE_OTHER): Payer: 59 | Admitting: Family Medicine

## 2020-09-07 ENCOUNTER — Other Ambulatory Visit: Payer: Self-pay

## 2020-09-07 ENCOUNTER — Encounter: Payer: Self-pay | Admitting: Family Medicine

## 2020-09-07 VITALS — BP 96/64 | HR 71 | Temp 98.5°F | Resp 16 | Ht 73.0 in | Wt 217.0 lb

## 2020-09-07 DIAGNOSIS — N401 Enlarged prostate with lower urinary tract symptoms: Secondary | ICD-10-CM | POA: Diagnosis not present

## 2020-09-07 DIAGNOSIS — R351 Nocturia: Secondary | ICD-10-CM

## 2020-09-07 DIAGNOSIS — K219 Gastro-esophageal reflux disease without esophagitis: Secondary | ICD-10-CM | POA: Diagnosis not present

## 2020-09-07 DIAGNOSIS — R5382 Chronic fatigue, unspecified: Secondary | ICD-10-CM

## 2020-09-07 DIAGNOSIS — R1310 Dysphagia, unspecified: Secondary | ICD-10-CM

## 2020-09-07 DIAGNOSIS — Z87898 Personal history of other specified conditions: Secondary | ICD-10-CM | POA: Diagnosis not present

## 2020-09-07 NOTE — Progress Notes (Signed)
Established patient visit   Patient: Jonathan Gallegos   DOB: 08-05-1960   60 y.o. Male  MRN: [id number] Visit Date: 09/07/2020  Today's healthcare provider: Azalee Course Aarika Moon, FNP   Chief Complaint  Patient presents with  . Fatigue   Subjective    HPI  Patient reports that over the last few months he has had excessive fatigue.  Was sick last August Was negative for COVID at that time He is concerned if he was actually positive at that time and having lingering effects Had a friend who recently had blockages which makes him nervous Uses breathe right strip Does have a history of snoring His father had sleep apnea  Recently had shingles Never got either Would like to get this  Had an esophageal dilation in the past This occurred from GERD Has been having episodes again Was going to wait until colonoscopy to get this done Due for another colonoscopy 09/2021  Flomax helps If he doesn't take it has issues with urination    Medications: Outpatient Medications Prior to Visit  Medication Sig  . EPINEPHrine (EPIPEN 2-PAK) 0.3 mg/0.3 mL IJ SOAJ injection Inject 0.3 mLs (0.3 mg total) into the muscle once.  . tamsulosin (FLOMAX) 0.4 MG CAPS capsule TAKE 1 CAPSULE BY MOUTH ONCE DAILY AFTER  SUPPER  . triamcinolone cream (KENALOG) 0.1 % Apply 1 application topically 2 (two) times daily.  . Albuterol Sulfate (PROAIR RESPICLICK) 108 (90 Base) MCG/ACT AEPB Inhale 1-2 puffs into the lungs every 4 (four) hours as needed. (Patient not taking: Reported on 09/07/2020)   No facility-administered medications prior to visit.    Review of Systems  Constitutional: Positive for fatigue. Negative for activity change.  Respiratory: Negative for cough and shortness of breath.   Cardiovascular: Negative for chest pain.  Neurological: Negative for dizziness and headaches.      Objective    BP 96/64   Pulse 71   Temp 98.5 F (36.9 C)   Resp 16   Ht 6\' 1"  (1.854 m)   Wt 217 lb  (98.4 kg)   BMI 28.63 kg/m   Physical Exam Vitals reviewed.  Constitutional:      Appearance: Normal appearance.  HENT:     Head: Normocephalic and atraumatic.  Eyes:     Conjunctiva/sclera: Conjunctivae normal.     Pupils: Pupils are equal, round, and reactive to light.  Cardiovascular:     Rate and Rhythm: Normal rate and regular rhythm.     Pulses: Normal pulses.     Heart sounds: Normal heart sounds. No murmur heard. No friction rub. No gallop.   Pulmonary:     Effort: Pulmonary effort is normal. No respiratory distress.     Breath sounds: Normal breath sounds. No stridor. No wheezing or rales.  Abdominal:     General: Bowel sounds are normal.     Palpations: Abdomen is soft.     Tenderness: There is no abdominal tenderness.  Musculoskeletal:     Right lower leg: No edema.     Left lower leg: No edema.  Skin:    General: Skin is warm and dry.  Neurological:     General: No focal deficit present.     Mental Status: He is alert and oriented to person, place, and time.  Psychiatric:        Mood and Affect: Mood normal.        Behavior: Behavior normal.      No results found for  any visits on 09/07/20.  Assessment & Plan     Problem List Items Addressed This Visit      Digestive   Dysphagia     Other   BPH associated with nocturia   Relevant Orders   Ambulatory referral to Urology    Other Visit Diagnoses    Chronic fatigue    -  Primary   Relevant Orders   Ambulatory referral to Sleep Studies   History of snoring       Relevant Orders   Ambulatory referral to Sleep Studies   Gastroesophageal reflux disease without esophagitis         Plan  Referral placed for a sleep study  Referral placed to urology to discuss procedures available for BPH  RTC precautions provided  Return in about 6 months (around 03/09/2021).      Azalee Course Slevin Gunby, FNP  [facility] [phone] (phone) [phone] (fax)  [facility]

## 2020-09-07 NOTE — Patient Instructions (Signed)

## 2020-10-10 ENCOUNTER — Ambulatory Visit: Payer: 59 | Admitting: Urology

## 2020-10-11 ENCOUNTER — Encounter: Payer: Self-pay | Admitting: Urology

## 2020-10-26 ENCOUNTER — Other Ambulatory Visit: Payer: Self-pay

## 2020-10-26 ENCOUNTER — Encounter: Payer: Self-pay | Admitting: Urology

## 2020-10-26 ENCOUNTER — Ambulatory Visit (INDEPENDENT_AMBULATORY_CARE_PROVIDER_SITE_OTHER): Payer: 59 | Admitting: Urology

## 2020-10-26 VITALS — BP 119/73 | HR 78 | Ht 73.0 in | Wt 218.1 lb

## 2020-10-26 DIAGNOSIS — N401 Enlarged prostate with lower urinary tract symptoms: Secondary | ICD-10-CM

## 2020-10-26 DIAGNOSIS — R351 Nocturia: Secondary | ICD-10-CM

## 2020-10-26 DIAGNOSIS — N529 Male erectile dysfunction, unspecified: Secondary | ICD-10-CM | POA: Diagnosis not present

## 2020-10-26 LAB — BLADDER SCAN AMB NON-IMAGING

## 2020-10-26 MED ORDER — TADALAFIL 5 MG PO TABS: 5.0000 mg | ORAL_TABLET | ORAL | 5 refills | Status: DC

## 2020-10-26 NOTE — Progress Notes (Signed)
   10/26/20 2:26 PM   Jonathan Gallegos 10-04-1960 [id number]  CC: BPH and ED  HPI: Healthy 60 year old male with worsening urinary symptoms over the last year, as well as erectile dysfunction.  He reports that he has had worsening frequency, weak stream, and feeling of incomplete emptying despite Flomax over the last year.  IPSS score today is 20, with quality of life mixed.  If he misses a dose of the Flomax he notices significant worsening of his urinary symptoms and difficulty urinating.  He has never had UTIs, gross hematuria, or retention previously.  He also has had problems with erections over the last few years.  This started after he had a death in the family and he and his wife had a very challenging time with this.  He is interested in trying medications for the erections.  PSA normal at 1.4 in August 2021.  Urinalysis benign today with 0-5 WBCs, 0-2 RBCs, no bacteria, nitrite negative.  PVR elevated at 190 mL.  PMH: Past Medical History:  Diagnosis Date  . BPH (benign prostatic hyperplasia)     Surgical History: Past Surgical History:  Procedure Laterality Date  . COLONOSCOPY WITH PROPOFOL N/A 10/05/2016   Procedure: COLONOSCOPY WITH PROPOFOL;  Surgeon: Wyline Mood, MD;  Location: [facility] ENDOSCOPY;  Service: Endoscopy;  Laterality: N/A;  . KNEE SURGERY Right 2013    Family History: Family History  Problem Relation Age of Onset  . Bipolar disorder Mother   . Hypertension Father   . Cancer Father   . Colon polyps Father   . Leukemia Paternal Grandfather     Social History:  reports that he has never smoked. He has never used smokeless tobacco. He reports current alcohol use. He reports that he does not use drugs.  Physical Exam: BP 119/73 (BP Location: Left Arm, Patient Position: Sitting, Cuff Size: Normal)   Pulse 78   Ht 6\' 1"  (1.854 m)   Wt 218 lb 1.6 oz (98.9 kg)   BMI 28.77 kg/m    Constitutional:  Alert and oriented, No acute distress. Cardiovascular:  No clubbing, cyanosis, or edema. Respiratory: Normal respiratory effort, no increased work of breathing. GI: Abdomen is soft, nontender, nondistended, no abdominal masses GU: Phallus with widely patent meatus, no lesions, testicles 20 cc and some bilaterally without masses DRE: 60 g, smooth, no nodules or masses  Laboratory Data: Reviewed  Pertinent Imaging: None to review  Assessment & Plan:   60 year old male with BPH and ED.  Regarding the BPH he has worsening symptoms and incomplete emptying despite Flomax, and I recommended cystoscopy and TRUS for further evaluation of outlet procedures like UroLift or HOLEP.  In terms of his ED, I recommended a trial of Cialis 5 mg every other day, and this may also help improve some of the urination.  Risks and benefits discussed.  Trial of Cialis 5 mg every other day for ED RTC 2 to 4 weeks for cystoscopy and TRUS for BPH, discuss ED  46, MD 10/26/2020  [facility] [address] [place], [place] [phone]

## 2020-10-26 NOTE — Patient Instructions (Addendum)
Cystoscopy Cystoscopy is a procedure that is used to help diagnose and sometimes treat conditions that affect the lower urinary tract. The lower urinary tract includes the bladder and the urethra. The urethra is the tube that drains urine from the bladder. Cystoscopy is done using a thin, tube-shaped instrument with a light and camera at the end (cystoscope). The cystoscope may be hard or flexible, depending on the goal of the procedure. The cystoscope is inserted through the urethra, into the bladder. Cystoscopy may be recommended if you have:  Urinary tract infections that keep coming back.  Blood in the urine (hematuria).  An inability to control when you urinate (urinary incontinence) or an overactive bladder.  Unusual cells found in a urine sample.  A blockage in the urethra, such as a urinary stone.  Painful urination.  An abnormality in the bladder found during an intravenous pyelogram (IVP) or CT scan. Cystoscopy may also be done to remove a sample of tissue to be examined under a microscope (biopsy). What are the risks? Generally, this is a safe procedure. However, problems may occur, including:  Infection.  Bleeding.  What happens during the procedure?  1. You will be given one or more of the following: ? A medicine to numb the area (local anesthetic). 2. The area around the opening of your urethra will be cleaned. 3. The cystoscope will be passed through your urethra into your bladder. 4. Germ-free (sterile) fluid will flow through the cystoscope to fill your bladder. The fluid will stretch your bladder so that your health care provider can clearly examine your bladder walls. 5. Your doctor will look at the urethra and bladder. 6. The cystoscope will be removed The procedure may vary among health care providers  What can I expect after the procedure? After the procedure, it is common to have: 1. Some soreness or pain in your abdomen and urethra. 2. Urinary symptoms.  These include: ? Mild pain or burning when you urinate. Pain should stop within a few minutes after you urinate. This may last for up to 1 week. ? A small amount of blood in your urine for several days. ? Feeling like you need to urinate but producing only a small amount of urine. Follow these instructions at home: General instructions  Return to your normal activities as told by your health care provider.   Do not drive for 24 hours if you were given a sedative during your procedure.  Watch for any blood in your urine. If the amount of blood in your urine increases, call your health care provider.  If a tissue sample was removed for testing (biopsy) during your procedure, it is up to you to get your test results. Ask your health care provider, or the department that is doing the test, when your results will be ready.  Drink enough fluid to keep your urine pale yellow.  Keep all follow-up visits as told by your health care provider. This is important. Contact a health care provider if you:  Have pain that gets worse or does not get better with medicine, especially pain when you urinate.  Have trouble urinating.  Have more blood in your urine. Get help right away if you:  Have blood clots in your urine.  Have abdominal pain.  Have a fever or chills.  Are unable to urinate. Summary  Cystoscopy is a procedure that is used to help diagnose and sometimes treat conditions that affect the lower urinary tract.  Cystoscopy is done using   a thin, tube-shaped instrument with a light and camera at the end.  After the procedure, it is common to have some soreness or pain in your abdomen and urethra.  Watch for any blood in your urine. If the amount of blood in your urine increases, call your health care provider.  If you were prescribed an antibiotic medicine, take it as told by your health care provider. Do not stop taking the antibiotic even if you start to feel better. This  information is not intended to replace advice given to you by your health care provider. Make sure you discuss any questions you have with your health care provider. Document Revised: 05/06/2018 Document Reviewed: 05/06/2018 Elsevier Patient Education  2020 [employer]  Benign Prostatic Hyperplasia  Benign prostatic hyperplasia (BPH) is an enlarged prostate gland that is caused by the normal aging process and not by cancer. The prostate is a walnut-sized gland that is involved in the production of semen. It is located in front of the rectum and below the bladder. The bladder stores urine and the urethra is the tube that carries the urine out of the body. The prostate may get bigger as a man gets older. An enlarged prostate can press on the urethra. This can make it harder to pass urine. The build-up of urine in the bladder can cause infection. Back pressure and infection may progress to bladder damage and kidney (renal) failure. What are the causes? This condition is part of a normal aging process. However, not all men develop problems from this condition. If the prostate enlarges away from the urethra, urine flow will not be blocked. If it enlarges toward the urethra and compresses it, there will be problems passing urine. What increases the risk? This condition is more likely to develop in men over the age of 50 years. What are the signs or symptoms? Symptoms of this condition include:  Getting up often during the night to urinate.  Needing to urinate frequently during the day.  Difficulty starting urine flow.  Decrease in size and strength of your urine stream.  Leaking (dribbling) after urinating.  Inability to pass urine. This needs immediate treatment.  Inability to completely empty your bladder.  Pain when you pass urine. This is more common if there is also an infection.  Urinary tract infection (UTI). How is this diagnosed? This condition is diagnosed based on your  medical history, a physical exam, and your symptoms. Tests will also be done, such as:  A post-void bladder scan. This measures any amount of urine that may remain in your bladder after you finish urinating.  A digital rectal exam. In a rectal exam, your health care provider checks your prostate by putting a lubricated, gloved finger into your rectum to feel the back of your prostate gland. This exam detects the size of your gland and any abnormal lumps or growths.  An exam of your urine (urinalysis).  A prostate specific antigen (PSA) screening. This is a blood test used to screen for prostate cancer.  An ultrasound. This test uses sound waves to electronically produce a picture of your prostate gland. Your health care provider may refer you to a specialist in kidney and prostate diseases (urologist). How is this treated? Once symptoms begin, your health care provider will monitor your condition (active surveillance or watchful waiting). Treatment for this condition will depend on the severity of your condition. Treatment may include:  Observation and yearly exams. This may be the only treatment needed if  your condition and symptoms are mild.  Medicines to relieve your symptoms, including: ? Medicines to shrink the prostate. ? Medicines to relax the muscle of the prostate.  Surgery in severe cases. Surgery may include: ? Prostatectomy. In this procedure, the prostate tissue is removed completely through an open incision or with a laparoscope or robotics. ? Transurethral resection of the prostate (TURP). In this procedure, a tool is inserted through the opening at the tip of the penis (urethra). It is used to cut away tissue of the inner core of the prostate. The pieces are removed through the same opening of the penis. This removes the blockage. ? Transurethral incision (TUIP). In this procedure, small cuts are made in the prostate. This lessens the prostate's pressure on the  urethra. ? Transurethral microwave thermotherapy (TUMT). This procedure uses microwaves to create heat. The heat destroys and removes a small amount of prostate tissue. ? Transurethral needle ablation (TUNA). This procedure uses radio frequencies to destroy and remove a small amount of prostate tissue. ? Interstitial laser coagulation (ILC). This procedure uses a laser to destroy and remove a small amount of prostate tissue. ? Transurethral electrovaporization (TUVP). This procedure uses electrodes to destroy and remove a small amount of prostate tissue. ? Prostatic urethral lift. This procedure inserts an implant to push the lobes of the prostate away from the urethra. Follow these instructions at home:  Take over-the-counter and prescription medicines only as told by your health care provider.  Monitor your symptoms for any changes. Contact your health care provider with any changes.  Avoid drinking large amounts of liquid before going to bed or out in public.  Avoid or reduce how much caffeine or alcohol you drink.  Give yourself time when you urinate.  Keep all follow-up visits as told by your health care provider. This is important. Contact a health care provider if:  You have unexplained back pain.  Your symptoms do not get better with treatment.  You develop side effects from the medicine you are taking.  Your urine becomes very dark or has a bad smell.  Your lower abdomen becomes distended and you have trouble passing your urine. Get help right away if:  You have a fever or chills.  You suddenly cannot urinate.  You feel lightheaded, or very dizzy, or you faint.  There are large amounts of blood or clots in the urine.  Your urinary problems become hard to manage.  You develop moderate to severe low back or flank pain. The flank is the side of your body between the ribs and the hip. These symptoms may represent a serious problem that is an emergency. Do not wait to  see if the symptoms will go away. Get medical help right away. Call your local emergency services (911 in the [place] Do not drive yourself to the hospital. Summary  Benign prostatic hyperplasia (BPH) is an enlarged prostate that is caused by the normal aging process and not by cancer.  An enlarged prostate can press on the urethra. This can make it hard to pass urine.  This condition is part of a normal aging process and is more likely to develop in men over the age of 50 years.  Get help right away if you suddenly cannot urinate. This information is not intended to replace advice given to you by your health care provider. Make sure you discuss any questions you have with your health care provider. Document Revised: 01/21/2020 Document Reviewed: 01/21/2020 Elsevier Patient Education  [place].

## 2020-10-27 ENCOUNTER — Other Ambulatory Visit: Payer: Self-pay | Admitting: Physician Assistant

## 2020-10-27 DIAGNOSIS — N401 Enlarged prostate with lower urinary tract symptoms: Secondary | ICD-10-CM

## 2020-10-28 LAB — MICROSCOPIC EXAMINATION
Bacteria, UA: NONE SEEN
Epithelial Cells (non renal): NONE SEEN /hpf (ref 0–10)

## 2020-10-28 LAB — URINALYSIS, COMPLETE
Bilirubin, UA: NEGATIVE
Glucose, UA: NEGATIVE
Ketones, UA: NEGATIVE
Leukocytes,UA: NEGATIVE
Nitrite, UA: NEGATIVE
Protein,UA: NEGATIVE
Specific Gravity, UA: 1.015 (ref 1.005–1.030)
Urobilinogen, Ur: 0.2 mg/dL (ref 0.2–1.0)
pH, UA: 6.5 (ref 5.0–7.5)

## 2020-10-31 ENCOUNTER — Other Ambulatory Visit: Payer: Self-pay

## 2020-10-31 DIAGNOSIS — N401 Enlarged prostate with lower urinary tract symptoms: Secondary | ICD-10-CM

## 2020-10-31 MED ORDER — TAMSULOSIN HCL 0.4 MG PO CAPS: ORAL_CAPSULE | ORAL | 1 refills | Status: DC

## 2020-10-31 NOTE — Telephone Encounter (Signed)
Copied from CRM [id number]. Topic: General - Other >> Oct 31, 2020  9:41 AM Payton Spark N wrote: Reason for CRM: Pts wife called in to check the status of the pt medication refill for Tamsulosin HCl 0.4 MG, wife states pt only has one pill left that will last until tonight. Please advise

## 2020-11-17 ENCOUNTER — Encounter: Payer: Self-pay | Admitting: Urology

## 2020-11-17 ENCOUNTER — Ambulatory Visit (INDEPENDENT_AMBULATORY_CARE_PROVIDER_SITE_OTHER): Payer: 59 | Admitting: Urology

## 2020-11-17 ENCOUNTER — Other Ambulatory Visit: Payer: Self-pay

## 2020-11-17 VITALS — BP 94/57 | HR 79 | Ht 73.0 in | Wt 212.0 lb

## 2020-11-17 DIAGNOSIS — N138 Other obstructive and reflux uropathy: Secondary | ICD-10-CM | POA: Diagnosis not present

## 2020-11-17 DIAGNOSIS — N401 Enlarged prostate with lower urinary tract symptoms: Secondary | ICD-10-CM

## 2020-11-17 NOTE — Patient Instructions (Signed)
Holmium Laser Enucleation of the Prostate (HoLEP)  HoLEP is a treatment for men with benign prostatic hyperplasia (BPH). The laser surgery removed blockages of urine flow, and is done without any incisions on the body.     What is HoLEP?  HoLEP is a type of laser surgery used to treat obstruction (blockage) of urine flow as a result of benign prostatic hyperplasia (BPH). In men with BPH, the prostate gland is not cancerous, but has become enlarged. An enlarged prostate can result in a number of urinary tract symptoms such as weak urinary stream, difficulty in starting urination, inability to urinate, frequent urination, or getting up at night to urinate.  HoLEP was developed in the 1990's as a more effective and less expensive surgical option for BPH, compared to other surgical options such as laser vaporization(PVP/greenlight laser), transurethral resection of the prostate(TURP), and open simple prostatectomy.   What happens during a HoLEP?  HoLEP requires general anesthesia ("asleep" throughout the procedure).   An antibiotic is given to reduce the risk of infection  A surgical instrument called a resectoscope is inserted through the urethra (the tube that carries urine from the bladder). The resectoscope has a camera that allows the surgeon to view the internal structure of the prostate gland, and to see where the incisions are being made during surgery.  The laser is inserted into the resectoscope and is used to enucleate (free up) the enlarged prostate tissue from the capsule (outer shell) and then to seal up any blood vessels. The tissue that has been removed is pushed back into the bladder.  A morcellator is placed through the resectoscope, and is used to suction out the prostate tissue that has been pushed into the bladder.  When the prostate tissue has been removed, the resectoscope is removed, and a foley catheter is placed to allow healing and drain the urine from the  bladder.     What happens after a HoLEP?  More than 90% of patients go home the same day a few hours after surgery. Less than 10% will be admitted to the hospital overnight for observation to monitor the urine, or if they have other medical problems.  Fluid is flushed through the catheter for about 1 hour after surgery to clear any blood from the urine. It is normal to have some blood in the urine after surgery. The need for blood transfusion is extremely rare.  Eating and drinking are permitted after the procedure once the patient has fully awakened from anesthesia.  The catheter is usually removed 2-3 days after surgery- the patient will come to clinic to have the catheter removed and make sure they can urinate on their own.  It is very important to drink lots of fluids after surgery for one week to keep the bladder flushed.  At first, there may be some burning with urination, but this typically improved within a few hours to days. Most patients do not have a significant amount of pain, and narcotic pain medications are rarely needed.  Symptoms of urinary frequency, urgency, and even leakage are NORMAL for the first few weeks after surgery as the bladder adjusts after having to work hard against blockage from the prostate for many years. This will improve, but can sometimes take several months.  The use of pelvic floor exercises (Kegel exercises) can help improve problems with urinary incontinence.   After catheter removal, patients will be seen at 6 weeks and 6 months for symptom check  No heavy lifting for   at least 2-3 weeks after surgery, however patients can walk and do light activities the first day after surgery. Return to work time depends on occupation.    What are the advantages of HoLEP?  HoLEP has been studied in many different parts of the world and has been shown to be a safe and effective procedure. Although there are many types of BPH surgeries available, HoLEP offers a  unique advantage in being able to remove a large amount of tissue without any incisions on the body, even in very large prostates, while decreasing the risk of bleeding and providing tissue for pathology (to look for cancer). This decreases the need for blood transfusions during surgery, minimizes hospital stay, and reduces the risk of needing repeat treatment.  What are the side effects of HoLEP?  Temporary burning and bleeding during urination. Some blood may be seen in the urine for weeks after surgery and is part of the healing process.  Urinary incontinence (inability to control urine flow) is expected in all patients immediately after surgery and they should wear pads for the first few days/weeks. This typically improves over the course of several weeks. Performing Kegel exercises can help decrease leakage from stress maneuvers such as coughing, sneezing, or lifting. The rate of long term leakage is very low. Patients may also have leakage with urgency and this may be treated with medication. The risk of urge incontinence can be dependent on several factors including age, prostate size, symptoms, and other medical problems.  Retrograde ejaculation or "backwards ejaculation." In 75% of cases, the patient will not see any fluid during ejaculation after surgery.  Erectile function is generally not significantly affected.   What are the risks of HoLEP?  Injury to the urethra or development of scar tissue at a later date  Injury to the capsule of the prostate (typically treated with longer catheterization).  Injury to the bladder or ureteral orifices (where the urine from the kidney drains out)  Infection of the bladder, testes, or kidneys  Return of urinary obstruction at a later date requiring another operation (<2%)  Need for blood transfusion or re-operation due to bleeding  Failure to relieve all symptoms and/or need for prolonged catheterization after surgery  5-15% of patients are  found to have previously undiagnosed prostate cancer in their specimen. Prostate cancer can be treated after HoLEP.  Standard risks of anesthesia including blood clots, heart attacks, etc  When should I call my doctor?  Fever over 101.3 degrees  Inability to urinate, or large blood clots in the urine   Prostatic Urethral Lift  Prostatic urethral lift is a surgical procedure to relieve symptoms of prostate gland enlargement that occurs with age (benign prostatic hypertrophy, BPH). The part of the body that drains urine from the bladder (urethra) passes between the two lobes of the prostate. As the prostate enlarges, it can push on the urethra and cause problems with urinating. This procedure involves placing an implant that holds the prostate away from the urethra. The procedure is performed with a thin operating telescope (cystoscope) that is inserted through the tip of the penis and moved up the urethra to theprostate. This is less invasive than other procedures that require an incision. You may have this procedure if: You have symptoms of BPH. Your prostate is not severely enlarged. Medicines to treat BPH are not working or not tolerated. You want to avoid possible sexual side effects from medicines or other procedures that are used to treat BPH. Tell a  health care provider about: Any allergies you have. All medicines you are taking, including vitamins, herbs, eye drops, creams, and over-the-counter medicines. Previous problems you or members of your family have had with the use of anesthetics. Any blood disorders you have. Previous surgeries you have had. Any medical conditions you have. What are the risks? Bleeding. Infection. Leaking of urine (incontinence). Allergic reactions to medicines. Return of BPH symptoms after 2 years, requiring more treatment. What happens before the procedure? Ask your health care provider about: Changing or stopping your regular medicines. This is  especially important if you are taking diabetes medicines or blood thinners. Taking medicines such as aspirin and ibuprofen. These medicines can thin your blood. Do not take these medicines before your procedure if your health care provider instructs you not to. Follow instructions from your health care provider about eating or drinking restrictions. Plan to have someone take you home from the hospital or clinic. What happens during the procedure? To lower your risk of infection: Your health care team will wash or sanitize their hands. Your skin will be washed with soap. An IV may be inserted into one of your veins. You will be given one or more of the following: A medicine to help you relax (sedative). A medicine that is injected into your urethra to numb the area (local anesthetic). A medicine to make you fall asleep (general anesthetic). A cystoscope will be inserted into your penis and moved through your urethra to your prostate. A device will be inserted through the cystoscope and used to press the lobes of your prostate away from your urethra. Implants will be inserted through the device to hold the lobes of your prostate in the widened position. The device and cystoscope will be removed. The procedure may vary among health care providers and hospitals. What happens after the procedure? Your blood pressure, heart rate, breathing rate, and blood oxygen level will be monitored until the medicines you were given have worn off. Do not drive for 24 hours if you were given a sedative. Summary Prostatic urethral lift is a surgical procedure to relieve symptoms of prostate gland enlargement that occurs with age (benign prostatic hypertrophy, BPH). The procedure is performed with a thin operating telescope (cystoscope) that is inserted through the tip of the penis and moved up the part of the body that drains urine from the bladder (urethra) to reach the prostate. This is less invasive than other  procedures that require an incision. Plan to have someone take you home from the hospital or clinic. You will not be allowed to drive for 24 hours if you were given a sedative during the procedure. This information is not intended to replace advice given to you by your health care provider. Make sure you discuss any questions you have with your healthcare provider. Document Revised: 01/21/2020 Document Reviewed: 01/21/2020 Elsevier Patient Education  2022 [employer].

## 2020-11-17 NOTE — Progress Notes (Signed)
Cystoscopy Procedure Note:  Indication: LUTS-frequency, weak stream, urgency, feeling of incomplete emptying.  Symptoms worsen significantly if he misses a dose of Flomax.  PVR last visit was elevated at 190 mL, PSA was normal at 1.4 in August 2021.  Urinalysis benign.  After informed consent and discussion of the procedure and its risks, Jonathan Gallegos was positioned and prepped in the standard fashion. Cystoscopy was performed with a flexible cystoscope. The urethra, bladder neck and entire bladder was visualized in a standard fashion. The prostate was moderate in size with obstructing lateral lobes. The ureteral orifices were visualized in their normal location and orientation.  Bladder mucosa normal throughout, no suspicious lesions, no abnormalities on retroflexion.  Imaging: Transrectal ultrasound was inserted into the rectum and measurements taken for a calculated prostate volume of 47 g  Findings: Enlarged obstructive appearing prostate measuring 47 g, suspect BPH as etiology of his urinary symptoms  Assessment and Plan: We discussed options including addition of finasteride, UroLift, or HOLEP.  Risks and benefits discussed extensively of these different approaches.  He would like to read more about UroLift and HOLEP prior to making a final decision, and extensive handout information was provided today.  Patient will call to schedule UroLift or HOLEP  Legrand Rams, MD 11/17/2020

## 2021-02-23 ENCOUNTER — Ambulatory Visit: Payer: Self-pay | Admitting: *Deleted

## 2021-02-23 NOTE — Telephone Encounter (Signed)
[phone] [facility] pharmacy

## 2021-02-23 NOTE — Telephone Encounter (Signed)
Reason for Disposition  [1] COVID-19 diagnosed by positive lab test (e.g., PCR, rapid self-test kit) AND [2] mild symptoms (e.g., cough, fever, others) AND [6] no complications or SOB  Answer Assessment - Initial Assessment Questions 1. COVID-19 DIAGNOSIS: "Who made your COVID-19 diagnosis?" "Was it confirmed by a positive lab test or self-test?" If not diagnosed by a doctor (or NP/PA), ask "Are there lots of cases (community spread) where you live?" Note: See public health department website, if unsure.     Home test this AM 2. COVID-19 EXPOSURE: "Was there any known exposure to COVID before the symptoms began?" CDC Definition of close contact: within 6 feet (2 meters) for a total of 15 minutes or more over a 24-hour period.      no 3. ONSET: "When did the COVID-19 symptoms start?"      Yesterday 4. WORST SYMPTOM: "What is your worst symptom?" (e.g., cough, fever, shortness of breath, muscle aches)     Body aches, fatigued 5. COUGH: "Do you have a cough?" If Yes, ask: "How bad is the cough?"       Dry 6. FEVER: "Do you have a fever?" If Yes, ask: "What is your temperature, how was it measured, and when did it start?"     Last night  99.0 7. RESPIRATORY STATUS: "Describe your breathing?" (e.g., shortness of breath, wheezing, unable to speak)      No 8. BETTER-SAME-WORSE: "Are you getting better, staying the same or getting worse compared to yesterday?"  If getting worse, ask, "In what way?"     Better than last night 9. HIGH RISK DISEASE: "Do you have any chronic medical problems?" (e.g., asthma, heart or lung disease, weak immune system, obesity, etc.)      10. VACCINE: "Have you had the COVID-19 vaccine?" If Yes, ask: "Which one, how many shots, when did you get it?"       Yes , moderna, 07/2019 11. BOOSTER: "Have you received your COVID-19 booster?" If Yes, ask: "Which one and when did you get it?"       no  13. OTHER SYMPTOMS: "Do you have any other symptoms?"  (e.g., chills, fatigue,  headache, loss of smell or taste, muscle pain, sore throat)       Fatigue 14. O2 SATURATION MONITOR:  "Do you use an oxygen saturation monitor (pulse oximeter) at home?" If Yes, ask "What is your reading (oxygen level) today?" "What is your usual oxygen saturation reading?" (e.g., 95%)       NA  Protocols used: Coronavirus (COVID-19) Diagnosed or Suspected-A-AH

## 2021-02-23 NOTE — Telephone Encounter (Signed)
Lmtcb. Pec please advise.

## 2021-02-23 NOTE — Telephone Encounter (Signed)
Pt returned call, message read to pt, information provided. Pt verbalizes understanding.

## 2021-02-23 NOTE — Telephone Encounter (Signed)
Pt reports covid positive, home test this AM. Onset of symptoms last night; scratchy throat,body aches, dry cough, T99.0..  Reports this AM dry cough and body aches , sore throat resolved. O2 sat 97%. Reports some chest tightness, "Like I can't get anything up and out." Denies any SOB, no CP. Home care advise given per protocol, reviewed guidelines for self isolation and symptoms that warrant an ED eval. Pt verbalizes understanding. Pt is interested in oral anti-viral meds. If appropriate, [facility] [address] Pt has appt with Dr. Beryle Flock 03/16/21.  CB# [phone]

## 2021-02-23 NOTE — Telephone Encounter (Signed)
Give him [facility] contact info for screening for antivirals and possible treatment

## 2021-02-24 ENCOUNTER — Telehealth: Payer: Self-pay

## 2021-02-24 NOTE — Telephone Encounter (Signed)
Copied from CRM [id number]. Topic: General - Other >> Feb 23, 2021  4:04 PM Gaetana Michaelis A wrote: Reason for CRM: Patient has tested positive for COVID 19 via an at home test this morning 02/23/21  The patient is currently experiencing a chills, sore throat and congestion   The patient would like to be prescribed something to help with discomfort  Please contact the patient's wife further when possible

## 2021-02-24 NOTE — Telephone Encounter (Signed)
Last GFR was 94 on 01/05/2020. It looks like it averages 89-99 when we check annually. Please review. Thanks!

## 2021-02-24 NOTE — Telephone Encounter (Signed)
Pt returned call, message read to pt, information provided. Pt verbalizes understanding.  

## 2021-02-24 NOTE — Telephone Encounter (Signed)
Please give him the info for [facility] pharmacy. He can have consult with pharmacist about Paxlovid and they can Rx.

## 2021-03-16 ENCOUNTER — Encounter: Payer: Self-pay | Admitting: Family Medicine

## 2021-03-16 ENCOUNTER — Ambulatory Visit (INDEPENDENT_AMBULATORY_CARE_PROVIDER_SITE_OTHER): Payer: 59 | Admitting: Family Medicine

## 2021-03-16 ENCOUNTER — Other Ambulatory Visit: Payer: Self-pay

## 2021-03-16 VITALS — BP 116/79 | HR 61 | Temp 98.0°F | Ht 73.0 in | Wt 227.0 lb

## 2021-03-16 DIAGNOSIS — N401 Enlarged prostate with lower urinary tract symptoms: Secondary | ICD-10-CM | POA: Diagnosis not present

## 2021-03-16 DIAGNOSIS — R7303 Prediabetes: Secondary | ICD-10-CM

## 2021-03-16 DIAGNOSIS — Z0000 Encounter for general adult medical examination without abnormal findings: Secondary | ICD-10-CM

## 2021-03-16 DIAGNOSIS — R351 Nocturia: Secondary | ICD-10-CM

## 2021-03-16 DIAGNOSIS — E782 Mixed hyperlipidemia: Secondary | ICD-10-CM | POA: Diagnosis not present

## 2021-03-16 DIAGNOSIS — H9012 Conductive hearing loss, unilateral, left ear, with unrestricted hearing on the contralateral side: Secondary | ICD-10-CM

## 2021-03-16 MED ORDER — TAMSULOSIN HCL 0.4 MG PO CAPS: ORAL_CAPSULE | ORAL | 1 refills | Status: DC

## 2021-03-16 NOTE — Assessment & Plan Note (Signed)
Reviewed last lipid panel Not currently on a statin Recheck FLP and CMP Discussed diet and exercise  

## 2021-03-16 NOTE — Assessment & Plan Note (Addendum)
Chronic and stable Continue flomax at current dose No longer on cialis 2/2 angioedema 

## 2021-03-16 NOTE — Progress Notes (Signed)
Complete physical exam   Patient: Jonathan Gallegos   DOB: 08-16-1960   60 y.o. Male  MRN: [id number] Visit Date: 03/16/2021  Today's healthcare provider: Shirlee Latch, MD   Chief Complaint  Patient presents with   Follow-up   Subjective    Jonathan Gallegos is a 60 y.o. male who presents today for a complete physical exam.  He reports consuming a general diet. The patient does not participate in regular exercise at present. He generally feels well. He reports sleeping fairly well. He does not have additional problems to discuss today.  HPI  -will receive 2nd shingles and flu vaccine at [facility] appointment already scheduled -reports having covid 4 weeks ago has few lingering symptoms  As noticed some L ear hearing loss, esp upper registers  Past Medical History:  Diagnosis Date   BPH (benign prostatic hyperplasia)    Past Surgical History:  Procedure Laterality Date   COLONOSCOPY WITH PROPOFOL N/A 10/05/2016   Procedure: COLONOSCOPY WITH PROPOFOL;  Surgeon: Wyline Mood, MD;  Location: [facility] ENDOSCOPY;  Service: Endoscopy;  Laterality: N/A;   KNEE SURGERY Right 2013   Social History   Socioeconomic History   Marital status: Married    Spouse name: Not on file   Number of children: 2   Years of education: Not on file   Highest education level: Not on file  Occupational History   Not on file  Tobacco Use   Smoking status: Never   Smokeless tobacco: Never  Substance and Sexual Activity   Alcohol use: Yes    Alcohol/week: 0.0 standard drinks    Comment: ocassionaly   Drug use: No   Sexual activity: Yes    Birth control/protection: None  Other Topics Concern   Not on file  Social History Narrative   Not on file   Social Determinants of Health   Financial Resource Strain: Not on file  Food Insecurity: Not on file  Transportation Needs: Not on file  Physical Activity: Not on file  Stress: Not on file  Social Connections: Not on file  Intimate  Partner Violence: Not on file   Family Status  Relation Name Status   Mother  Deceased   Father  Deceased   PGF  Deceased at age 15   Brother  Alive   Daughter  Alive   MGF  Deceased       OLD AGE   Brother  Alive   Daughter  Alive   MGM  Deceased       cause of death: Pneumonia   PGM  Deceased       old age   Family History  Problem Relation Age of Onset   Bipolar disorder Mother    Hypertension Father    Cancer Father    Colon polyps Father    Leukemia Paternal Grandfather    Allergies  Allergen Reactions   Cialis [Tadalafil] Swelling    Patient Care Team: Erasmo Downer, MD as PCP - General (Family Medicine)   Medications: Outpatient Medications Prior to Visit  Medication Sig   Albuterol Sulfate (PROAIR RESPICLICK) 108 (90 Base) MCG/ACT AEPB Inhale 1-2 puffs into the lungs every 4 (four) hours as needed.   EPINEPHrine (EPIPEN 2-PAK) 0.3 mg/0.3 mL IJ SOAJ injection Inject 0.3 mLs (0.3 mg total) into the muscle once.   triamcinolone cream (KENALOG) 0.1 % Apply 1 application topically 2 (two) times daily.   [DISCONTINUED] tadalafil (CIALIS) 5 MG tablet Take 1 tablet (5  mg total) by mouth every other day.   [DISCONTINUED] tamsulosin (FLOMAX) 0.4 MG CAPS capsule TAKE 1 CAPSULE BY MOUTH ONCE DAILY AFTER  SUPPER (Patient not taking: Reported on 03/16/2021)   No facility-administered medications prior to visit.    Review of Systems  HENT:  Positive for hearing loss.   All other systems reviewed and are negative.    Objective    BP 116/79 (BP Location: Right Arm, Patient Position: Sitting, Cuff Size: Large)   Pulse 61   Temp 98 F (36.7 C) (Oral)   Ht 6\' 1"  (1.854 m)   Wt 227 lb (103 kg)   BMI 29.95 kg/m    Physical Exam Vitals reviewed.  Constitutional:      General: He is not in acute distress.    Appearance: Normal appearance. He is well-developed. He is not diaphoretic.  HENT:     Head: Normocephalic and atraumatic.     Right Ear: Tympanic  membrane, ear canal and external ear normal.     Left Ear: Tympanic membrane, ear canal and external ear normal.     Nose: Nose normal.     Mouth/Throat:     Mouth: Mucous membranes are moist.     Pharynx: Oropharynx is clear. No oropharyngeal exudate.  Eyes:     General: No scleral icterus.    Conjunctiva/sclera: Conjunctivae normal.     Pupils: Pupils are equal, round, and reactive to light.  Neck:     Thyroid: No thyromegaly.  Cardiovascular:     Rate and Rhythm: Normal rate and regular rhythm.     Pulses: Normal pulses.     Heart sounds: Normal heart sounds. No murmur heard. Pulmonary:     Effort: Pulmonary effort is normal. No respiratory distress.     Breath sounds: Normal breath sounds. No wheezing or rales.  Abdominal:     General: There is no distension.     Palpations: Abdomen is soft.     Tenderness: There is no abdominal tenderness.  Musculoskeletal:        General: No deformity.     Cervical back: Neck supple.     Right lower leg: No edema.     Left lower leg: No edema.  Lymphadenopathy:     Cervical: No cervical adenopathy.  Skin:    General: Skin is warm and dry.     Findings: No rash.  Neurological:     Mental Status: He is alert and oriented to person, place, and time. Mental status is at baseline.     Sensory: No sensory deficit.     Motor: No weakness.     Gait: Gait normal.  Psychiatric:        Mood and Affect: Mood normal.        Behavior: Behavior normal.        Thought Content: Thought content normal.      Last depression screening scores PHQ 2/9 Scores 03/16/2021 03/10/2020 12/31/2019  PHQ - 2 Score 0 2 0  PHQ- 9 Score 2 4 -   Last fall risk screening Fall Risk  03/16/2021  Falls in the past year? 0  Number falls in past yr: 0  Injury with Fall? 0  Risk for fall due to : No Fall Risks   Last Audit-C alcohol use screening Alcohol Use Disorder Test (AUDIT) 03/16/2021  1. How often do you have a drink containing alcohol? 2  2. How many  drinks containing alcohol do you have on a typical day when  you are drinking? 0  3. How often do you have six or more drinks on one occasion? 0  AUDIT-C Score 2  4. How often during the last year have you found that you were not able to stop drinking once you had started? -  5. How often during the last year have you failed to do what was normally expected from you because of drinking? -  6. How often during the last year have you needed a first drink in the morning to get yourself going after a heavy drinking session? -  7. How often during the last year have you had a feeling of guilt of remorse after drinking? -  8. How often during the last year have you been unable to remember what happened the night before because you had been drinking? -  9. Have you or someone else been injured as a result of your drinking? -  10. Has a relative or friend or a doctor or another health worker been concerned about your drinking or suggested you cut down? -  Alcohol Use Disorder Identification Test Final Score (AUDIT) -  Alcohol Brief Interventions/Follow-up -   A score of 3 or more in women, and 4 or more in men indicates increased risk for alcohol abuse, EXCEPT if all of the points are from question 1   No results found for any visits on 03/16/21.  Assessment & Plan    Routine Health Maintenance and Physical Exam  Exercise Activities and Dietary recommendations  Goals   None     Immunization History  Administered Date(s) Administered   Influenza,inj,Quad PF,6+ Mos 03/10/2020   Influenza-Unspecified 04/12/2017   Tdap 08/10/2016    Health Maintenance  Topic Date Due   COVID-19 Vaccine (1) Never done   Zoster Vaccines- Shingrix (1 of 2) Never done   INFLUENZA VACCINE  12/26/2020   COLONOSCOPY (Pts 45-76yrs Insurance coverage will need to be confirmed)  10/05/2021   TETANUS/TDAP  08/11/2026   Hepatitis C Screening  Completed   HIV Screening  Completed   Pneumococcal Vaccine 57-64 Years  old  Aged Out   HPV VACCINES  Aged Out    Discussed health benefits of physical activity, and encouraged him to engage in regular exercise appropriate for his age and condition.  Problem List Items Addressed This Visit       Nervous and Auditory   Conductive hearing loss of left ear   Relevant Orders   Ambulatory referral to Audiology     Other   BPH associated with nocturia    Chronic and stable Continue flomax at current dose No longer on cialis 2/2 angioedema      Relevant Medications   tamsulosin (FLOMAX) 0.4 MG CAPS capsule   Other Relevant Orders   PSA Total (Reflex To Free)   Prediabetes    Recommend low carb diet Recheck A1c       Relevant Orders   Hemoglobin A1c   Moderate mixed hyperlipidemia not requiring statin therapy    Reviewed last lipid panel Not currently on a statin Recheck FLP and CMP Discussed diet and exercise       Relevant Orders   Comprehensive metabolic panel   Lipid panel   Other Visit Diagnoses     Encounter for annual physical exam    -  Primary   Relevant Orders   Hemoglobin A1c   Comprehensive metabolic panel   Lipid panel   PSA Total (Reflex To Free)  Return in about 1 year (around 03/16/2022) for CPE.     I, Shirlee Latch, MD, have reviewed all documentation for this visit. The documentation on 03/16/21 for the exam, diagnosis, procedures, and orders are all accurate and complete.   Tiny Chaudhary, Marzella Schlein, MD, MPH [facility]

## 2021-03-16 NOTE — Assessment & Plan Note (Signed)
Recommend low carb diet °Recheck A1c  °

## 2021-03-18 LAB — COMPREHENSIVE METABOLIC PANEL
ALT: 13 IU/L (ref 0–44)
AST: 18 IU/L (ref 0–40)
Albumin/Globulin Ratio: 1.9 (ref 1.2–2.2)
Albumin: 4.5 g/dL (ref 3.8–4.9)
Alkaline Phosphatase: 59 IU/L (ref 44–121)
BUN/Creatinine Ratio: 15 (ref 9–20)
BUN: 15 mg/dL (ref 6–24)
Bilirubin Total: 0.5 mg/dL (ref 0.0–1.2)
CO2: 25 mmol/L (ref 20–29)
Calcium: 9.6 mg/dL (ref 8.7–10.2)
Chloride: 101 mmol/L (ref 96–106)
Creatinine, Ser: 0.98 mg/dL (ref 0.76–1.27)
Globulin, Total: 2.4 g/dL (ref 1.5–4.5)
Glucose: 91 mg/dL (ref 70–99)
Potassium: 4.8 mmol/L (ref 3.5–5.2)
Sodium: 140 mmol/L (ref 134–144)
Total Protein: 6.9 g/dL (ref 6.0–8.5)
eGFR: 89 mL/min/{1.73_m2} (ref >59)

## 2021-03-18 LAB — PSA TOTAL (REFLEX TO FREE): Prostate Specific Ag, Serum: 1.8 ng/mL (ref 0.0–4.0)

## 2021-03-18 LAB — LIPID PANEL
Chol/HDL Ratio: 3.2 ratio (ref 0.0–5.0)
Cholesterol, Total: 227 mg/dL — ABNORMAL HIGH (ref 100–199)
HDL: 71 mg/dL (ref >39)
LDL Chol Calc (NIH): 135 mg/dL — ABNORMAL HIGH (ref 0–99)
Triglycerides: 121 mg/dL (ref 0–149)
VLDL Cholesterol Cal: 21 mg/dL (ref 5–40)

## 2021-03-18 LAB — HEMOGLOBIN A1C
Est. average glucose Bld gHb Est-mCnc: 114 mg/dL
Hgb A1c MFr Bld: 5.6 % (ref 4.8–5.6)

## 2021-03-27 ENCOUNTER — Encounter: Payer: Self-pay | Admitting: Family Medicine

## 2021-04-25 ENCOUNTER — Other Ambulatory Visit: Payer: Self-pay | Admitting: Unknown Physician Specialty

## 2021-04-25 DIAGNOSIS — H9042 Sensorineural hearing loss, unilateral, left ear, with unrestricted hearing on the contralateral side: Secondary | ICD-10-CM

## 2021-07-31 ENCOUNTER — Encounter: Payer: Self-pay | Admitting: Family Medicine

## 2021-07-31 ENCOUNTER — Telehealth: Payer: Self-pay

## 2021-07-31 DIAGNOSIS — K219 Gastro-esophageal reflux disease without esophagitis: Secondary | ICD-10-CM

## 2021-07-31 DIAGNOSIS — Z1211 Encounter for screening for malignant neoplasm of colon: Secondary | ICD-10-CM

## 2021-07-31 NOTE — Telephone Encounter (Signed)
CALLED PATIENT NO ANSWER LEFT VOICEMAIL FOR A CALL BACK ? ?

## 2021-07-31 NOTE — Telephone Encounter (Signed)
Scheduled for 10/25/2021 ?

## 2021-07-31 NOTE — Telephone Encounter (Signed)
Ok to refer to GI for colon cancer screening. He can discuss appropriateness/need for EGD with GI prior to procedure.

## 2021-10-25 ENCOUNTER — Encounter: Payer: Self-pay | Admitting: Gastroenterology

## 2021-10-25 ENCOUNTER — Ambulatory Visit (INDEPENDENT_AMBULATORY_CARE_PROVIDER_SITE_OTHER): Payer: 59 | Admitting: Gastroenterology

## 2021-10-25 ENCOUNTER — Other Ambulatory Visit: Payer: Self-pay

## 2021-10-25 VITALS — BP 116/63 | HR 73 | Temp 98.0°F | Ht 73.0 in | Wt 211.0 lb

## 2021-10-25 DIAGNOSIS — R1319 Other dysphagia: Secondary | ICD-10-CM

## 2021-10-25 DIAGNOSIS — Z1211 Encounter for screening for malignant neoplasm of colon: Secondary | ICD-10-CM

## 2021-10-25 DIAGNOSIS — Z800 Family history of malignant neoplasm of digestive organs: Secondary | ICD-10-CM

## 2021-10-25 DIAGNOSIS — R1310 Dysphagia, unspecified: Secondary | ICD-10-CM

## 2021-10-25 MED ORDER — NA SULFATE-K SULFATE-MG SULF 17.5-3.13-1.6 GM/177ML PO SOLN: 354.0000 mL | Freq: Once | ORAL | 0 refills | Status: AC

## 2021-10-25 NOTE — Progress Notes (Signed)
Arlyss Repress, MD [address]  [address]  [place], [place] [address]  Main: [phone]  Fax: [phone]    Gastroenterology Consultation  Referring Provider:     Erasmo Downer, MD Primary Care Physician:  Erasmo Downer, MD Primary Gastroenterologist:  Dr. Arlyss Repress Reason for Consultation: Tightness in the chest, difficulty swallowing        HPI:   Jonathan Gallegos is a 61 y.o. male referred by Dr. Beryle Flock, Marzella Schlein, MD  for consultation & management of difficulty swallowing.  Patient reports that approximately 25 to 30 years ago, he had his esophagus stretched as it was reported that he had a scar tissue in the lower end of the esophagus.  Patient reports that he was briefly on Prevacid which temporarily helped.  He reports spasm in his lower esophagus especially with solids and cold liquids, associated with regurgitation.  He denies any weight loss.  He reports that the symptoms have been more or less stable but consistent.  He denies any nocturnal heartburn or regurgitation.  Patient does not smoke or drink alcohol.  He is interested to undergo upper endoscopy for further evaluation  He is also due for the colonoscopy.  Patient does report somewhat hard bowel movements depending on what he eats  NSAIDs: None  Antiplts/Anticoagulants/Anti thrombotics: None  GI Procedures:  Screening colonoscopy 10/05/2016 - Non-bleeding internal hemorrhoids. - The examination was otherwise normal on direct and retroflexion views. - No specimens collected.  Past Medical History:  Diagnosis Date   BPH (benign prostatic hyperplasia)     Past Surgical History:  Procedure Laterality Date   COLONOSCOPY WITH PROPOFOL N/A 10/05/2016   Procedure: COLONOSCOPY WITH PROPOFOL;  Surgeon: Wyline Mood, MD;  Location: [facility] ENDOSCOPY;  Service: Endoscopy;  Laterality: N/A;   KNEE SURGERY Right 2013   Current Outpatient Medications:    Albuterol Sulfate (PROAIR  RESPICLICK) 108 (90 Base) MCG/ACT AEPB, Inhale 1-2 puffs into the lungs every 4 (four) hours as needed., Disp: 1 each, Rfl: 1   EPINEPHrine (EPIPEN 2-PAK) 0.3 mg/0.3 mL IJ SOAJ injection, Inject 0.3 mLs (0.3 mg total) into the muscle once., Disp: 1 Device, Rfl: 1   Na Sulfate-K Sulfate-Mg Sulf 17.5-3.13-1.6 GM/177ML SOLN, Take 354 mLs by mouth once for 1 dose., Disp: 354 mL, Rfl: 0   tamsulosin (FLOMAX) 0.4 MG CAPS capsule, TAKE 1 CAPSULE BY MOUTH ONCE DAILY AFTER  SUPPER, Disp: 90 capsule, Rfl: 1   triamcinolone cream (KENALOG) 0.1 %, Apply 1 application topically 2 (two) times daily., Disp: 30 g, Rfl: 0   Family History  Problem Relation Age of Onset   Bipolar disorder Mother    Hypertension Father    Cancer Father    Colon polyps Father    Leukemia Paternal Grandfather      Social History   Tobacco Use   Smoking status: Never   Smokeless tobacco: Never  Substance Use Topics   Alcohol use: Yes    Alcohol/week: 0.0 standard drinks    Comment: ocassionaly   Drug use: No    Allergies as of 10/25/2021 - Review Complete 10/25/2021  Allergen Reaction Noted   Cialis [tadalafil] Swelling 03/16/2021    Review of Systems:    All systems reviewed and negative except where noted in HPI.   Physical Exam:  BP 116/63 (BP Location: Left Arm, Patient Position: Sitting, Cuff Size: Normal)   Pulse 73   Temp 98 F (36.7 C) (Oral)   Ht 6'  1" (1.854 m)   Wt 211 lb (95.7 kg)   BMI 27.84 kg/m  No LMP for male patient.  General:   Alert,  Well-developed, well-nourished, pleasant and cooperative in NAD Head:  Normocephalic and atraumatic. Eyes:  Sclera clear, no icterus.   Conjunctiva pink. Ears:  Normal auditory acuity. Nose:  No deformity, discharge, or lesions. Mouth:  No deformity or lesions,oropharynx pink & moist. Neck:  Supple; no masses or thyromegaly. Lungs:  Respirations even and unlabored.  Clear throughout to auscultation.   No wheezes, crackles, or rhonchi. No acute  distress. Heart:  Regular rate and rhythm; no murmurs, clicks, rubs, or gallops. Abdomen:  Normal bowel sounds. Soft, non-tender and non-distended without masses, hepatosplenomegaly or hernias noted.  No guarding or rebound tenderness.   Rectal: Not performed Msk:  Symmetrical without gross deformities. Good, equal movement & strength bilaterally. Pulses:  Normal pulses noted. Extremities:  No clubbing or edema.  No cyanosis. Neurologic:  Alert and oriented x3;  grossly normal neurologically. Skin:  Intact without significant lesions or rashes. No jaundice. Psych:  Alert and cooperative. Normal mood and affect.  Imaging Studies: No abdominal imaging  Assessment and Plan:   Jonathan Gallegos is a 61 y.o. pleasant Caucasian male with history of chronic dysphagia to solids and cold liquids not on any treatment  Recommend EGD with possible dilation and esophageal biopsies  Recommend screening colonoscopy Patient reports father with colon cancer in his early 24s  I have discussed alternative options, risks & benefits,  which include, but are not limited to, bleeding, infection, perforation,respiratory complication & drug reaction.  The patient agrees with this plan & written consent will be obtained.     Follow up based on the EGD findings   Arlyss Repress, MD

## 2021-11-09 ENCOUNTER — Telehealth: Payer: Self-pay | Admitting: Gastroenterology

## 2021-11-09 NOTE — Telephone Encounter (Signed)
Pt would like a call back concerning upcoming procedure

## 2021-11-13 NOTE — Telephone Encounter (Signed)
Patient is calling make sure that the colonoscopy is screening and that the EGD is a diagnostic. Informed him yes and we did PA through his  insurance

## 2021-11-15 ENCOUNTER — Ambulatory Visit: Payer: 59 | Admitting: Anesthesiology

## 2021-11-15 ENCOUNTER — Encounter
Admission: RE | Disposition: A | Discharge status: Home / Self Care | Payer: Self-pay | Source: Home / Self Care | Attending: Gastroenterology

## 2021-11-15 ENCOUNTER — Other Ambulatory Visit: Payer: Self-pay

## 2021-11-15 ENCOUNTER — Telehealth: Payer: Self-pay

## 2021-11-15 ENCOUNTER — Encounter: Payer: Self-pay | Admitting: Gastroenterology

## 2021-11-15 ENCOUNTER — Ambulatory Visit
Admission: RE | Admit: 2021-11-15 | Discharge: 2021-11-15 | Disposition: A | Discharge status: Home / Self Care | Payer: 59 | Attending: Gastroenterology | Admitting: Gastroenterology

## 2021-11-15 DIAGNOSIS — K2289 Other specified disease of esophagus: Secondary | ICD-10-CM | POA: Insufficient documentation

## 2021-11-15 DIAGNOSIS — K222 Esophageal obstruction: Secondary | ICD-10-CM

## 2021-11-15 DIAGNOSIS — Z1211 Encounter for screening for malignant neoplasm of colon: Secondary | ICD-10-CM

## 2021-11-15 DIAGNOSIS — K642 Third degree hemorrhoids: Secondary | ICD-10-CM | POA: Insufficient documentation

## 2021-11-15 DIAGNOSIS — K644 Residual hemorrhoidal skin tags: Secondary | ICD-10-CM | POA: Diagnosis not present

## 2021-11-15 DIAGNOSIS — K209 Esophagitis, unspecified without bleeding: Secondary | ICD-10-CM | POA: Diagnosis not present

## 2021-11-15 DIAGNOSIS — R1314 Dysphagia, pharyngoesophageal phase: Secondary | ICD-10-CM | POA: Insufficient documentation

## 2021-11-15 DIAGNOSIS — R1310 Dysphagia, unspecified: Secondary | ICD-10-CM

## 2021-11-15 SURGERY — COLONOSCOPY WITH PROPOFOL
Anesthesia: General

## 2021-11-15 MED ORDER — MIDAZOLAM HCL 2 MG/2ML IJ SOLN
INTRAMUSCULAR | Status: DC | PRN
  Administered 2021-11-15 (×2): 1 mg via INTRAVENOUS

## 2021-11-15 MED ORDER — LIDOCAINE HCL (CARDIAC) PF 100 MG/5ML IV SOSY
PREFILLED_SYRINGE | INTRAVENOUS | Status: DC | PRN
  Administered 2021-11-15: 100 mg via INTRAVENOUS

## 2021-11-15 MED ORDER — SODIUM CHLORIDE 0.9 % IV SOLN: INTRAVENOUS | Status: DC

## 2021-11-15 MED ORDER — OMEPRAZOLE 40 MG PO CPDR: 40.0000 mg | DELAYED_RELEASE_CAPSULE | Freq: Two times a day (BID) | ORAL | 3 refills | Status: AC

## 2021-11-15 MED ORDER — FENTANYL CITRATE (PF) 100 MCG/2ML IJ SOLN
INTRAMUSCULAR | Status: DC | PRN
  Administered 2021-11-15 (×2): 25 ug via INTRAVENOUS
  Administered 2021-11-15: 50 ug via INTRAVENOUS

## 2021-11-15 MED ORDER — PROPOFOL 10 MG/ML IV BOLUS
INTRAVENOUS | Status: DC | PRN
  Administered 2021-11-15: 50 mg via INTRAVENOUS

## 2021-11-15 MED ORDER — MIDAZOLAM HCL 2 MG/2ML IJ SOLN
INTRAMUSCULAR | Status: AC
  Filled 2021-11-15: qty 2

## 2021-11-15 MED ORDER — FENTANYL CITRATE (PF) 100 MCG/2ML IJ SOLN
INTRAMUSCULAR | Status: AC
  Filled 2021-11-15: qty 2

## 2021-11-15 MED ORDER — STERILE WATER FOR IRRIGATION IR SOLN
Status: DC | PRN
  Administered 2021-11-15: 60 mL

## 2021-11-15 MED ORDER — PROPOFOL 1000 MG/100ML IV EMUL
INTRAVENOUS | Status: AC
  Filled 2021-11-15: qty 100

## 2021-11-15 MED ORDER — PROPOFOL 500 MG/50ML IV EMUL
INTRAVENOUS | Status: DC | PRN
  Administered 2021-11-15: 75 ug/kg/min via INTRAVENOUS

## 2021-11-15 NOTE — Op Note (Signed)
[facility] Gastroenterology Patient Name: Jonathan Gallegos Procedure Date: 11/15/2021 10:11 AM MRN: [id number] Account #: [id number] Date of Birth: 21-Apr-1961 Admit Type: Outpatient Age: 61 Room: [facility] ENDO ROOM 4 Gender: Male Note Status: Finalized Instrument Name: Upper Endoscope [id number] Procedure:             Upper GI endoscopy Indications:           Esophageal dysphagia Providers:             Lin Landsman MD, MD Referring MD:          Dionne Bucy. Bacigalupo (Referring MD) Medicines:             General Anesthesia Complications:         No immediate complications. Estimated blood loss:                         Minimal. Procedure:             Pre-Anesthesia Assessment:                        - Prior to the procedure, a History and Physical was                         performed, and patient medications and allergies were                         reviewed. The patient is competent. The risks and                         benefits of the procedure and the sedation options and                         risks were discussed with the patient. All questions                         were answered and informed consent was obtained.                         Patient identification and proposed procedure were                         verified by the physician, the nurse, the                         anesthesiologist, the anesthetist and the technician                         in the pre-procedure area in the procedure room in the                         endoscopy suite. Mental Status Examination: alert and                         oriented. Airway Examination: normal oropharyngeal                         airway and neck mobility. Respiratory Examination:  clear to auscultation. CV Examination: normal.                         Prophylactic Antibiotics: The patient does not require                         prophylactic antibiotics. Prior Anticoagulants: The                          patient has taken no previous anticoagulant or                         antiplatelet agents. ASA Grade Assessment: II - A                         patient with mild systemic disease. After reviewing                         the risks and benefits, the patient was deemed in                         satisfactory condition to undergo the procedure. The                         anesthesia plan was to use general anesthesia.                         Immediately prior to administration of medications,                         the patient was re-assessed for adequacy to receive                         sedatives. The heart rate, respiratory rate, oxygen                         saturations, blood pressure, adequacy of pulmonary                         ventilation, and response to care were monitored                         throughout the procedure. The physical status of the                         patient was re-assessed after the procedure.                        After obtaining informed consent, the endoscope was                         passed under direct vision. Throughout the procedure,                         the patient's blood pressure, pulse, and oxygen                         saturations were monitored continuously. The Endoscope  was introduced through the mouth, and advanced to the                         second part of duodenum. The upper GI endoscopy was                         accomplished without difficulty. The patient tolerated                         the procedure well. Findings:      The duodenal bulb and second portion of the duodenum were normal.      The entire examined stomach was normal.      One benign-appearing, intrinsic mild stenosis was found 38 cm from the       incisors. This stenosis measured less than one cm (in length). The       stenosis was traversed. A TTS dilator was passed through the scope.       Dilation with a  15-16.5-18 mm balloon dilator was performed to 15 mm.       The dilation site was examined following endoscope reinsertion and       showed moderate mucosal disruption and complete resolution of luminal       narrowing. Estimated blood loss was minimal.      Mucosal changes including ringed esophagus, feline appearance,       longitudinal furrows and stenosis were found in the middle third of the       esophagus and in the lower third of the esophagus. Esophageal findings       were graded using the Eosinophilic Esophagitis Endoscopic Reference       Score (EoE-EREFS) as: Edema Grade 1 Present (decreased clarity or       absence of vascular markings), Rings Grade 1 Mild (subtle       circumferential ridges seen on esophageal distension), Exudates Grade 0       None (no white lesions seen), Furrows Grade 0 None (no vertical lines       seen) and Stricture present. Biopsies were taken with a cold forceps for       histology. Estimated blood loss was minimal. Impression:            - Normal duodenal bulb and second portion of the                         duodenum.                        - Normal stomach.                        - Benign-appearing esophageal stenosis. Dilated.                        - Esophageal mucosal changes suggestive of                         eosinophilic esophagitis. Biopsied. Recommendation:        - Discharge patient to home (with escort).                        - Resume previous diet today.                        -  Continue present medications.                        - Use Prilosec (omeprazole) 40 mg PO BID for 3 months.                        - Return to my office as previously scheduled.                        - Repeat upper endoscopy in 3 months for retreatment. Procedure Code(s):     --- Professional ---                        [phone], Esophagogastroduodenoscopy, flexible,                         transoral; with transendoscopic balloon dilation of                          esophagus (less than 30 mm diameter)                        43239, 59, Esophagogastroduodenoscopy, flexible,                         transoral; with biopsy, single or multiple Diagnosis Code(s):     --- Professional ---                        K22.2, Esophageal obstruction                        K22.8, Other specified diseases of esophagus                        R13.14, Dysphagia, pharyngoesophageal phase CPT copyright 2019 American Medical Association. All rights reserved. The codes documented in this report are preliminary and upon coder review may  be revised to meet current compliance requirements. Dr. Libby Maw Toney Reil MD, MD 11/15/2021 10:33:23 AM This report has been signed electronically. Number of Addenda: 0 Note Initiated On: 11/15/2021 10:11 AM Estimated Blood Loss:  Estimated blood loss was minimal.      [facility]

## 2021-11-15 NOTE — Op Note (Signed)
[facility] Gastroenterology Patient Name: Jonathan Gallegos Procedure Date: 11/15/2021 10:10 AM MRN: [id number] Account #: [id number] Date of Birth: March 02, 1961 Admit Type: Outpatient Age: 61 Room: [facility] ENDO ROOM 4 Gender: Male Note Status: Finalized Instrument Name: Colonoscope [id number] Procedure:             Colonoscopy Indications:           Screening for colorectal malignant neoplasm, Last                         colonoscopy: May 2018, Last colonoscopy 10 years ago Providers:             Toney Reil MD, MD Referring MD:          Marzella Schlein. Bacigalupo (Referring MD) Medicines:             General Anesthesia Complications:         No immediate complications. Estimated blood loss: None. Procedure:             Pre-Anesthesia Assessment:                        - Prior to the procedure, a History and Physical was                         performed, and patient medications and allergies were                         reviewed. The patient is competent. The risks and                         benefits of the procedure and the sedation options and                         risks were discussed with the patient. All questions                         were answered and informed consent was obtained.                         Patient identification and proposed procedure were                         verified by the physician, the nurse, the                         anesthesiologist, the anesthetist and the technician                         in the pre-procedure area in the procedure room in the                         endoscopy suite. Mental Status Examination: alert and                         oriented. Airway Examination: normal oropharyngeal                         airway and neck mobility. Respiratory Examination:  clear to auscultation. CV Examination: normal.                         Prophylactic Antibiotics: The patient does not require                          prophylactic antibiotics. Prior Anticoagulants: The                         patient has taken no previous anticoagulant or                         antiplatelet agents. ASA Grade Assessment: II - A                         patient with mild systemic disease. After reviewing                         the risks and benefits, the patient was deemed in                         satisfactory condition to undergo the procedure. The                         anesthesia plan was to use general anesthesia.                         Immediately prior to administration of medications,                         the patient was re-assessed for adequacy to receive                         sedatives. The heart rate, respiratory rate, oxygen                         saturations, blood pressure, adequacy of pulmonary                         ventilation, and response to care were monitored                         throughout the procedure. The physical status of the                         patient was re-assessed after the procedure.                        After obtaining informed consent, the colonoscope was                         passed under direct vision. Throughout the procedure,                         the patient's blood pressure, pulse, and oxygen                         saturations were monitored continuously. The  Colonoscope was introduced through the anus and                         advanced to the the cecum, identified by appendiceal                         orifice and ileocecal valve. The colonoscopy was                         performed without difficulty. The patient tolerated                         the procedure well. The quality of the bowel                         preparation was evaluated using the BBPS [facility] Bowel                         Preparation Scale) with scores of: Right Colon = 3,                         Transverse Colon = 3 and Left Colon = 3 (entire  mucosa                         seen well with no residual staining, small fragments                         of stool or opaque liquid). The total BBPS score                         equals 9. Findings:      Hemorrhoids were found on perianal exam.      The entire examined colon appeared normal.      Non-bleeding external and internal hemorrhoids were found during       retroflexion. The hemorrhoids were large and Grade III (internal       hemorrhoids that prolapse but require manual reduction). Impression:            - Hemorrhoids found on perianal exam.                        - The entire examined colon is normal.                        - Non-bleeding external and internal hemorrhoids.                        - No specimens collected. Recommendation:        - Repeat colonoscopy in 10 years for screening                         purposes.                        - Discharge patient to home (with escort).                        - Resume previous diet today.                        -  Continue present medications.                        - Return to my office at appointment to be scheduled                         in 1-2 weeks for hemorrhoid banding. Procedure Code(s):     --- Professional ---                        M0102, Colorectal cancer screening; colonoscopy on                         individual not meeting criteria for high risk Diagnosis Code(s):     --- Professional ---                        Z12.11, Encounter for screening for malignant neoplasm                         of colon                        K64.2, Third degree hemorrhoids CPT copyright 2019 American Medical Association. All rights reserved. The codes documented in this report are preliminary and upon coder review may  be revised to meet current compliance requirements. Dr. Libby Maw Toney Reil MD, MD 11/15/2021 10:50:17 AM This report has been signed electronically. Number of Addenda: 0 Note Initiated On:  11/15/2021 10:10 AM Scope Withdrawal Time: 0 hours 9 minutes 13 seconds  Total Procedure Duration: 0 hours 12 minutes 3 seconds  Estimated Blood Loss:  Estimated blood loss: none.      [facility]

## 2021-11-15 NOTE — Transfer of Care (Signed)
Immediate Anesthesia Transfer of Care Note  Patient: Jonathan Gallegos  Procedure(s) Performed: COLONOSCOPY WITH PROPOFOL ESOPHAGOGASTRODUODENOSCOPY (EGD) WITH PROPOFOL  Patient Location: PACU  Anesthesia Type:General  Level of Consciousness: sedated  Airway & Oxygen Therapy: Patient Spontanous Breathing and Patient connected to nasal cannula oxygen  Post-op Assessment: Report given to RN and Post -op Vital signs reviewed and stable  Post vital signs: Reviewed and stable  Last Vitals:  Vitals Value Taken Time  BP    Temp    Pulse 66 11/15/21 1052  Resp 14 11/15/21 1052  SpO2 100 % 11/15/21 1052  Vitals shown include unvalidated device data.  Last Pain:  Vitals:   11/15/21 0937  TempSrc: Temporal  PainSc: 0-No pain         Complications: No notable events documented.

## 2021-11-15 NOTE — H&P (Signed)
Arlyss Repress, MD [address]  [address]  [place], [place] [address]  Main: [phone]  Fax: [phone] Pager: [phone]  Primary Care Physician:  Erasmo Downer, MD Primary Gastroenterologist:  Dr. Arlyss Repress  Pre-Procedure History & Physical: HPI:  Jonathan Gallegos is a 61 y.o. male is here for an endoscopy and colonoscopy.   Past Medical History:  Diagnosis Date   BPH (benign prostatic hyperplasia)     Past Surgical History:  Procedure Laterality Date   COLONOSCOPY WITH PROPOFOL N/A 10/05/2016   Procedure: COLONOSCOPY WITH PROPOFOL;  Surgeon: Wyline Mood, MD;  Location: [facility] ENDOSCOPY;  Service: Endoscopy;  Laterality: N/A;   KNEE SURGERY Right 2013    Prior to Admission medications   Medication Sig Start Date End Date Taking? Authorizing Provider  Albuterol Sulfate (PROAIR RESPICLICK) 108 (90 Base) MCG/ACT AEPB Inhale 1-2 puffs into the lungs every 4 (four) hours as needed. 01/25/20   Margaretann Loveless, PA-C  EPINEPHrine (EPIPEN 2-PAK) 0.3 mg/0.3 mL IJ SOAJ injection Inject 0.3 mLs (0.3 mg total) into the muscle once. 10/31/15   Arnaldo Natal, MD  tamsulosin (FLOMAX) 0.4 MG CAPS capsule TAKE 1 CAPSULE BY MOUTH ONCE DAILY AFTER  SUPPER 03/16/21   Bacigalupo, Marzella Schlein, MD  triamcinolone cream (KENALOG) 0.1 % Apply 1 application topically 2 (two) times daily. 03/10/20   Trey Sailors, PA-C    Allergies as of 10/25/2021 - Review Complete 10/25/2021  Allergen Reaction Noted   Cialis [tadalafil] Swelling 03/16/2021    Family History  Problem Relation Age of Onset   Bipolar disorder Mother    Hypertension Father    Cancer Father    Colon polyps Father    Leukemia Paternal Grandfather     Social History   Socioeconomic History   Marital status: Married    Spouse name: Not on file   Number of children: 2   Years of education: Not on file   Highest education level: Not on file  Occupational History   Not on file  Tobacco Use    Smoking status: Never   Smokeless tobacco: Never  Vaping Use   Vaping Use: Never used  Substance and Sexual Activity   Alcohol use: Yes    Alcohol/week: 0.0 standard drinks of alcohol    Comment: ocassionaly   Drug use: No   Sexual activity: Yes    Birth control/protection: None  Other Topics Concern   Not on file  Social History Narrative   Not on file   Social Determinants of Health   Financial Resource Strain: Not on file  Food Insecurity: Not on file  Transportation Needs: Not on file  Physical Activity: Not on file  Stress: Not on file  Social Connections: Not on file  Intimate Partner Violence: Not on file    Review of Systems: See HPI, otherwise negative ROS  Physical Exam: BP 116/74   Pulse (!) 56   Temp (!) 97.2 F (36.2 C) (Temporal)   Resp 16   Ht 6\' 1"  (1.854 m)   Wt 92.5 kg   SpO2 100%   BMI 26.91 kg/m  General:   Alert,  pleasant and cooperative in NAD Head:  Normocephalic and atraumatic. Neck:  Supple; no masses or thyromegaly. Lungs:  Clear throughout to auscultation.    Heart:  Regular rate and rhythm. Abdomen:  Soft, nontender and nondistended. Normal bowel sounds, without guarding, and without rebound.   Neurologic:  Alert and  oriented x4;  grossly  normal neurologically.  Impression/Plan: Jonathan Gallegos is here for an endoscopy and colonoscopy to be performed for history of chronic dysphagia to solids and cold liquids not on any treatment, colon cancer screening  Risks, benefits, limitations, and alternatives regarding  endoscopy and colonoscopy have been reviewed with the patient.  Questions have been answered.  All parties agreeable.   Lannette Donath, MD  11/15/2021, 10:13 AM

## 2021-11-15 NOTE — Telephone Encounter (Signed)
Called and left a message for call back  

## 2021-11-15 NOTE — Telephone Encounter (Signed)
Patient called back and made appointment.

## 2021-11-15 NOTE — Anesthesia Postprocedure Evaluation (Signed)
Anesthesia Post Note  Patient: Jonathan Gallegos  Procedure(s) Performed: COLONOSCOPY WITH PROPOFOL ESOPHAGOGASTRODUODENOSCOPY (EGD) WITH PROPOFOL  Patient location during evaluation: PACU Anesthesia Type: General Level of consciousness: awake and alert Pain management: pain level controlled Vital Signs Assessment: post-procedure vital signs reviewed and stable Respiratory status: spontaneous breathing, nonlabored ventilation, respiratory function stable and patient connected to nasal cannula oxygen Cardiovascular status: blood pressure returned to baseline and stable Postop Assessment: no apparent nausea or vomiting Anesthetic complications: no   No notable events documented.   Last Vitals:  Vitals:   11/15/21 1052 11/15/21 1112  BP: (!) 110/54 112/76  Pulse:  61  Resp:    Temp: (!) 36.1 C   SpO2:  99%    Last Pain:  Vitals:   11/15/21 1112  TempSrc:   PainSc: 0-No pain                 Yevette Edwards

## 2021-11-15 NOTE — Telephone Encounter (Signed)
EGD also needs to be schedule in 3 months

## 2021-11-15 NOTE — Anesthesia Preprocedure Evaluation (Signed)
Anesthesia Evaluation  Patient identified by MRN, date of birth, ID band Patient awake    Reviewed: Allergy & Precautions, H&P , NPO status , Patient's Chart, lab work & pertinent test results, reviewed documented beta blocker date and time   Airway Mallampati: II   Neck ROM: full    Dental  (+) Poor Dentition   Pulmonary neg pulmonary ROS,    Pulmonary exam normal        Cardiovascular negative cardio ROS Normal cardiovascular exam Rhythm:regular Rate:Normal     Neuro/Psych negative neurological ROS  negative psych ROS   GI/Hepatic negative GI ROS, Neg liver ROS,   Endo/Other  negative endocrine ROS  Renal/GU negative Renal ROS  negative genitourinary   Musculoskeletal   Abdominal   Peds  Hematology negative hematology ROS (+)   Anesthesia Other Findings Past Medical History: No date: BPH (benign prostatic hyperplasia) Past Surgical History: 10/05/2016: COLONOSCOPY WITH PROPOFOL; N/A     Comment:  Procedure: COLONOSCOPY WITH PROPOFOL;  Surgeon: Wyline Mood, MD;  Location[facility] ENDOSCOPY;  Service:               Endoscopy;  Laterality: N/A; 2013: KNEE SURGERY; Right BMI    Body Mass Index: 26.91 kg/m     Reproductive/Obstetrics negative OB ROS                             Anesthesia Physical Anesthesia Plan  ASA: 2  Anesthesia Plan: General   Post-op Pain Management:    Induction:   PONV Risk Score and Plan:   Airway Management Planned:   Additional Equipment:   Intra-op Plan:   Post-operative Plan:   Informed Consent: I have reviewed the patients History and Physical, chart, labs and discussed the procedure including the risks, benefits and alternatives for the proposed anesthesia with the patient or authorized representative who has indicated his/her understanding and acceptance.     Dental Advisory Given  Plan Discussed with: CRNA  Anesthesia  Plan Comments:         Anesthesia Quick Evaluation

## 2021-11-15 NOTE — Telephone Encounter (Signed)
-----   Message from Toney Reil, MD sent at 11/15/2021 11:00 AM EDT ----- Regarding: banding Please schedule follow up appt in 1-2 weeks for banding, 1st time  RV

## 2021-11-17 ENCOUNTER — Encounter: Payer: Self-pay | Admitting: Gastroenterology

## 2021-11-17 LAB — SURGICAL PATHOLOGY

## 2021-11-22 ENCOUNTER — Other Ambulatory Visit: Payer: Self-pay | Admitting: Family Medicine

## 2021-11-22 DIAGNOSIS — N401 Enlarged prostate with lower urinary tract symptoms: Secondary | ICD-10-CM

## 2021-11-22 NOTE — Telephone Encounter (Signed)
Requested Prescriptions  Pending Prescriptions Disp Refills  . tamsulosin (FLOMAX) 0.4 MG CAPS capsule [Pharmacy Med Name: Tamsulosin HCl 0.4 MG Oral Capsule] 90 capsule 0    Sig: TAKE 1 CAPSULE BY MOUTH ONCE DAILY AFTER SUPPER     Urology: Alpha-Adrenergic Blocker Passed - 11/22/2021 10:31 AM      Passed - PSA in normal range and within 360 days    PSA  Date Value Ref Range Status  11/05/2013 1.0  Final   Prostate Specific Ag, Serum  Date Value Ref Range Status  03/17/2021 1.8 0.0 - 4.0 ng/mL Final    Comment:    Roche ECLIA methodology. According to the American Urological Association, Serum PSA should decrease and remain at undetectable levels after radical prostatectomy. The AUA defines biochemical recurrence as an initial PSA value 0.2 ng/mL or greater followed by a subsequent confirmatory PSA value 0.2 ng/mL or greater. Values obtained with different assay methods or kits cannot be used interchangeably. Results cannot be interpreted as absolute evidence of the presence or absence of malignant disease.          Passed - Last BP in normal range    BP Readings from Last 1 Encounters:  11/15/21 112/76         Passed - Valid encounter within last 12 months    Recent Outpatient Visits          8 months ago Encounter for annual physical exam   [facility] [place], Marzella Schlein, MD   1 year ago Chronic fatigue   [facility], Azalee Course, FNP   1 year ago Rash   [facility], Lavella Hammock, [place]   1 year ago Pharyngitis, unspecified etiology   [facility], Alessandra Bevels, [place]   1 year ago Annual physical exam   [facility] [place], Alessandra Bevels, [place]      Future Appointments            Tomorrow Vanga, Loel Dubonnet, MD [facility] [place]   In 3 months Bacigalupo, Marzella Schlein, MD [facility]

## 2021-11-23 ENCOUNTER — Encounter: Payer: Self-pay | Admitting: Gastroenterology

## 2021-11-23 ENCOUNTER — Ambulatory Visit (INDEPENDENT_AMBULATORY_CARE_PROVIDER_SITE_OTHER): Payer: 59 | Admitting: Gastroenterology

## 2021-11-23 ENCOUNTER — Other Ambulatory Visit: Payer: Self-pay

## 2021-11-23 VITALS — BP 123/71 | HR 73 | Temp 97.8°F | Ht 73.0 in | Wt 211.5 lb

## 2021-11-23 DIAGNOSIS — K200 Eosinophilic esophagitis: Secondary | ICD-10-CM

## 2021-11-23 DIAGNOSIS — K642 Third degree hemorrhoids: Secondary | ICD-10-CM

## 2021-11-23 DIAGNOSIS — R1319 Other dysphagia: Secondary | ICD-10-CM

## 2021-11-23 MED ORDER — CLOTRIMAZOLE 1 % EX CREA: 1.0000 | TOPICAL_CREAM | Freq: Two times a day (BID) | CUTANEOUS | 0 refills | Status: DC

## 2021-11-23 NOTE — Progress Notes (Signed)
Arlyss Repress, MD [address]  [address]  [place], [place] [address]  Main: [phone]  Fax: [phone]    Gastroenterology Consultation  Referring Provider:     Erasmo Downer, MD Primary Care Physician:  Erasmo Downer, MD Primary Gastroenterologist:  Dr. Arlyss Repress Reason for Consultation: Tightness in the chest, difficulty swallowing        HPI:   Jonathan Gallegos is a 61 y.o. male referred by Dr. Beryle Flock, Marzella Schlein, MD  for consultation & management of difficulty swallowing.  Patient reports that approximately 25 to 30 years ago, he had his esophagus stretched as it was reported that he had a scar tissue in the lower end of the esophagus.  Patient reports that he was briefly on Prevacid which temporarily helped.  He reports spasm in his lower esophagus especially with solids and cold liquids, associated with regurgitation.  He denies any weight loss.  He reports that the symptoms have been more or less stable but consistent.  He denies any nocturnal heartburn or regurgitation.  Patient does not smoke or drink alcohol.  He is interested to undergo upper endoscopy for further evaluation  He is also due for the colonoscopy.  Patient does report somewhat hard bowel movements depending on what he eats  Follow-up visit 11/23/2021 Patient is here for follow-up of new diagnosis of eosinophilic esophagitis as well as for hemorrhoid banding He underwent upper endoscopy on 11/15/2021 which revealed benign esophageal stricture, dilated as well as esophageal biopsies confirmed EOE.  I have started patient on omeprazole 40 mg twice daily and patient reports that he has been doing very well.  He does not have choking episodes or episodes of regurgitation.  He is also interested in hemorrhoid banding due to symptomatic grade 3 hemorrhoids.  His hemorrhoidal symptoms included severe perianal itching, prolapse, has to manually reduce, rectal bleeding, rectal  discomfort/pressure, anal seepage  NSAIDs: None  Antiplts/Anticoagulants/Anti thrombotics: None  GI Procedures:  EGD and colonoscopy 11/15/2021 - Normal duodenal bulb and second portion of the duodenum. - Normal stomach. - Benign-appearing esophageal stenosis. Dilated. - Esophageal mucosal changes suggestive of eosinophilic esophagitis. Biopsied. DIAGNOSIS:  A.  ESOPHAGUS; COLD BIOPSY:  - STRATIFIED SQUAMOUS EPITHELIUM WITH DIFFUSE MARKED EOSINOPHILIC  INFILTRATE,  UP TO 65 INTRAEPITHELIAL EOSINOPHILS PER HIGH-POWER FIELD.  - NEGATIVE FOR DYSPLASIA AND MALIGNANCY  - Hemorrhoids found on perianal exam. - The entire examined colon is normal. - Non-bleeding external and internal hemorrhoids. - No specimens collected.  Screening colonoscopy 10/05/2016 - Non-bleeding internal hemorrhoids. - The examination was otherwise normal on direct and retroflexion views. - No specimens collected.  Past Medical History:  Diagnosis Date   BPH (benign prostatic hyperplasia)     Past Surgical History:  Procedure Laterality Date   COLONOSCOPY WITH PROPOFOL N/A 10/05/2016   Procedure: COLONOSCOPY WITH PROPOFOL;  Surgeon: Wyline Mood, MD;  Location: [facility] ENDOSCOPY;  Service: Endoscopy;  Laterality: N/A;   COLONOSCOPY WITH PROPOFOL N/A 11/15/2021   Procedure: COLONOSCOPY WITH PROPOFOL;  Surgeon: Toney Reil, MD;  Location: [facility] ENDOSCOPY;  Service: Gastroenterology;  Laterality: N/A;   ESOPHAGOGASTRODUODENOSCOPY (EGD) WITH PROPOFOL N/A 11/15/2021   Procedure: ESOPHAGOGASTRODUODENOSCOPY (EGD) WITH PROPOFOL;  Surgeon: Toney Reil, MD;  Location: [facility] ENDOSCOPY;  Service: Gastroenterology;  Laterality: N/A;   KNEE SURGERY Right 2013   Current Outpatient Medications:    Albuterol Sulfate (PROAIR RESPICLICK) 108 (90 Base) MCG/ACT AEPB, Inhale 1-2 puffs into the lungs every 4 (four) hours as  needed., Disp: 1 each, Rfl: 1   clotrimazole (LOTRIMIN) 1 % cream, Apply 1 Application topically 2  (two) times daily., Disp: 30 g, Rfl: 0   EPINEPHrine (EPIPEN 2-PAK) 0.3 mg/0.3 mL IJ SOAJ injection, Inject 0.3 mLs (0.3 mg total) into the muscle once., Disp: 1 Device, Rfl: 1   omeprazole (PRILOSEC) 40 MG capsule, Take 1 capsule (40 mg total) by mouth 2 (two) times daily before a meal., Disp: 60 capsule, Rfl: 3   tamsulosin (FLOMAX) 0.4 MG CAPS capsule, TAKE 1 CAPSULE BY MOUTH ONCE DAILY AFTER SUPPER, Disp: 90 capsule, Rfl: 0   triamcinolone cream (KENALOG) 0.1 %, Apply 1 application topically 2 (two) times daily., Disp: 30 g, Rfl: 0   Family History  Problem Relation Age of Onset   Bipolar disorder Mother    Hypertension Father    Cancer Father    Colon polyps Father    Leukemia Paternal Grandfather      Social History   Tobacco Use   Smoking status: Never   Smokeless tobacco: Never  Vaping Use   Vaping Use: Never used  Substance Use Topics   Alcohol use: Yes    Alcohol/week: 0.0 standard drinks of alcohol    Comment: ocassionaly   Drug use: No    Allergies as of 11/23/2021 - Review Complete 11/23/2021  Allergen Reaction Noted   Cialis [tadalafil] Swelling 03/16/2021    Review of Systems:    All systems reviewed and negative except where noted in HPI.   Physical Exam:  BP 123/71 (BP Location: Left Arm, Patient Position: Sitting, Cuff Size: Normal)   Pulse 73   Temp 97.8 F (36.6 C) (Oral)   Ht 6\' 1"  (1.854 m)   Wt 211 lb 8 oz (95.9 kg)   BMI 27.90 kg/m  No LMP for male patient.  General:   Alert,  Well-developed, well-nourished, pleasant and cooperative in NAD Head:  Normocephalic and atraumatic. Eyes:  Sclera clear, no icterus.   Conjunctiva pink. Ears:  Normal auditory acuity. Nose:  No deformity, discharge, or lesions. Mouth:  No deformity or lesions,oropharynx pink & moist. Neck:  Supple; no masses or thyromegaly. Lungs:  Respirations even and unlabored.  Clear throughout to auscultation.   No wheezes, crackles, or rhonchi. No acute distress. Heart:   Regular rate and rhythm; no murmurs, clicks, rubs, or gallops. Abdomen:  Normal bowel sounds. Soft, non-tender and non-distended without masses, hepatosplenomegaly or hernias noted.  No guarding or rebound tenderness.   Rectal: Prolapsed right posterior hemorrhoid, manually reduced, normal perianal skin, nontender digital rectal exam Msk:  Symmetrical without gross deformities. Good, equal movement & strength bilaterally. Pulses:  Normal pulses noted. Extremities:  No clubbing or edema.  No cyanosis. Neurologic:  Alert and oriented x3;  grossly normal neurologically. Skin:  Intact without significant lesions or rashes. No jaundice. Psych:  Alert and cooperative. Normal mood and affect.  Imaging Studies: No abdominal imaging  Assessment and Plan:   Jonathan Gallegos is a 61 y.o. 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 twice daily before meals Discussed about 6 food/4 food illumination diet, information provided Recommend repeat EGD with proximal and distal esophageal biopsies in 3 months  I have discussed alternative options, risks & benefits,  which include, but are not limited to, bleeding, infection, perforation,respiratory complication & drug reaction.  The patient agrees with this  plan & written consent will be obtained.    Grade III hemorrhoids I have discussed about outpatient hemorrhoid ligation, the procedure, risks and benefits Consent obtained Proceed with hemorrhoid ligation today   Follow up in 2 weeks  Cephas Darby, MD

## 2021-11-23 NOTE — Progress Notes (Signed)

## 2021-12-07 ENCOUNTER — Ambulatory Visit (INDEPENDENT_AMBULATORY_CARE_PROVIDER_SITE_OTHER): Payer: 59 | Admitting: Gastroenterology

## 2021-12-07 ENCOUNTER — Encounter: Payer: Self-pay | Admitting: Gastroenterology

## 2021-12-07 VITALS — BP 114/71 | HR 79 | Temp 97.9°F | Wt 210.0 lb

## 2021-12-07 DIAGNOSIS — K642 Third degree hemorrhoids: Secondary | ICD-10-CM

## 2021-12-07 NOTE — Progress Notes (Signed)
PROCEDURE NOTE: The patient presents with symptomatic grade 3 hemorrhoids, unresponsive to maximal medical therapy, requesting rubber band ligation of his/her hemorrhoidal disease.  All risks, benefits and alternative forms of therapy were described and informed consent was obtained.   The decision was made to band the LL internal hemorrhoid, and the [facility] System was used to perform band ligation without complication.  Digital anorectal examination was then performed to assure proper positioning of the band, and to adjust the banded tissue as required.  The patient was discharged home without pain or other issues.  Dietary and behavioral recommendations were given and (if necessary - prescriptions were given), along with follow-up instructions.  The patient will return 2 weeks for follow-up and possible additional banding as required.  No complications were encountered and the patient tolerated the procedure well.    

## 2021-12-28 ENCOUNTER — Ambulatory Visit: Payer: 59 | Admitting: Gastroenterology

## 2021-12-29 ENCOUNTER — Telehealth: Payer: Self-pay | Admitting: Gastroenterology

## 2021-12-29 NOTE — Telephone Encounter (Signed)
Patient cancelled endoscopy sched for 02/15/2022

## 2022-01-01 NOTE — Telephone Encounter (Signed)
Patient wants to cancel the EGD for 02/15/2022. He states his symptoms are better. Called endo and talk to Selena Batten to cancel procedure

## 2022-01-04 ENCOUNTER — Ambulatory Visit (INDEPENDENT_AMBULATORY_CARE_PROVIDER_SITE_OTHER): Payer: 59 | Admitting: Gastroenterology

## 2022-01-04 ENCOUNTER — Encounter: Payer: Self-pay | Admitting: Gastroenterology

## 2022-01-04 VITALS — BP 115/68 | HR 66 | Temp 97.9°F | Ht 73.0 in | Wt 214.4 lb

## 2022-01-04 DIAGNOSIS — K642 Third degree hemorrhoids: Secondary | ICD-10-CM | POA: Diagnosis not present

## 2022-01-04 DIAGNOSIS — K200 Eosinophilic esophagitis: Secondary | ICD-10-CM | POA: Diagnosis not present

## 2022-01-04 NOTE — Progress Notes (Signed)
PROCEDURE NOTE: The patient presents with symptomatic grade 3 hemorrhoids, unresponsive to maximal medical therapy, requesting rubber band ligation of his hemorrhoidal disease.  All risks, benefits and alternative forms of therapy were described and informed consent was obtained.  Perianal exam today revealed prolapsed right posterior external hemorrhoid.  Therefore, the decision was made to band the RP internal hemorrhoid, and the [facility] O'Regan System was used to perform band ligation without complication.  Digital anorectal examination was then performed to assure proper positioning of the band, and to adjust the banded tissue as required.  The patient was discharged home without pain or other issues.  Dietary and behavioral recommendations were given and (if necessary - prescriptions were given), along with follow-up instructions.  The patient will return 4 weeks for follow-up and possible additional banding as required.  No complications were encountered and the patient tolerated the procedure well.

## 2022-01-04 NOTE — Progress Notes (Signed)
Arlyss Repress, MD [address]  [address]  [place], [place] [address]  Main: [phone]  Fax: [phone]    Gastroenterology Consultation  Referring Provider:     Erasmo Downer, MD Primary Care Physician:  Erasmo Downer, MD Primary Gastroenterologist:  Dr. Arlyss Repress Reason for Consultation: Tightness in the chest, difficulty swallowing        HPI:   Jonathan Gallegos is a 61 y.o. male referred by Dr. Beryle Flock, Marzella Schlein, MD  for consultation & management of difficulty swallowing.  Patient reports that approximately 25 to 30 years ago, he had his esophagus stretched as it was reported that he had a scar tissue in the lower end of the esophagus.  Patient reports that he was briefly on Prevacid which temporarily helped.  He reports spasm in his lower esophagus especially with solids and cold liquids, associated with regurgitation.  He denies any weight loss.  He reports that the symptoms have been more or less stable but consistent.  He denies any nocturnal heartburn or regurgitation.  Patient does not smoke or drink alcohol.  He is interested to undergo upper endoscopy for further evaluation  He is also due for the colonoscopy.  Patient does report somewhat hard bowel movements depending on what he eats  Follow-up visit 11/23/2021 Patient is here for follow-up of new diagnosis of eosinophilic esophagitis as well as for hemorrhoid banding He underwent upper endoscopy on 11/15/2021 which revealed benign esophageal stricture, dilated as well as esophageal biopsies confirmed EOE.  I have started patient on omeprazole 40 mg twice daily and patient reports that he has been doing very well.  He does not have choking episodes or episodes of regurgitation.  He is also interested in hemorrhoid banding due to symptomatic grade 3 hemorrhoids.  His hemorrhoidal symptoms included severe perianal itching, prolapse, has to manually reduce, rectal bleeding, rectal  discomfort/pressure, anal seepage   Follow-up visit 01/04/2022 Patient is here for hemorrhoid banding.  He continues to have severe perianal itching.  He also reports prolapse of the hemorrhoidal tissue after a bowel movement.  There are days when he is not very active, notices having hard bowel movement.  With regards to eosinophilic esophagitis, patient is asymptomatic and has been taking omeprazole 40 mg twice daily before meals.  He wanted to postpone upper endoscopy due to financial reasons and also because he is doing well and would like to continue Prilosec longer than 3 months.  NSAIDs: None  Antiplts/Anticoagulants/Anti thrombotics: None  GI Procedures:  EGD and colonoscopy 11/15/2021 - Normal duodenal bulb and second portion of the duodenum. - Normal stomach. - Benign-appearing esophageal stenosis. Dilated. - Esophageal mucosal changes suggestive of eosinophilic esophagitis. Biopsied. DIAGNOSIS:  A.  ESOPHAGUS; COLD BIOPSY:  - STRATIFIED SQUAMOUS EPITHELIUM WITH DIFFUSE MARKED EOSINOPHILIC  INFILTRATE,  UP TO 65 INTRAEPITHELIAL EOSINOPHILS PER HIGH-POWER FIELD.  - NEGATIVE FOR DYSPLASIA AND MALIGNANCY  - Hemorrhoids found on perianal exam. - The entire examined colon is normal. - Non-bleeding external and internal hemorrhoids. - No specimens collected.  Screening colonoscopy 10/05/2016 - Non-bleeding internal hemorrhoids. - The examination was otherwise normal on direct and retroflexion views. - No specimens collected.  Past Medical History:  Diagnosis Date   BPH (benign prostatic hyperplasia)     Past Surgical History:  Procedure Laterality Date   COLONOSCOPY WITH PROPOFOL N/A 10/05/2016   Procedure: COLONOSCOPY WITH PROPOFOL;  Surgeon: Wyline Mood, MD;  Location: [facility] ENDOSCOPY;  Service: Endoscopy;  Laterality:  N/A;   COLONOSCOPY WITH PROPOFOL N/A 11/15/2021   Procedure: COLONOSCOPY WITH PROPOFOL;  Surgeon: Toney Reil, MD;  Location: [facility] ENDOSCOPY;   Service: Gastroenterology;  Laterality: N/A;   ESOPHAGOGASTRODUODENOSCOPY (EGD) WITH PROPOFOL N/A 11/15/2021   Procedure: ESOPHAGOGASTRODUODENOSCOPY (EGD) WITH PROPOFOL;  Surgeon: Toney Reil, MD;  Location: [facility] ENDOSCOPY;  Service: Gastroenterology;  Laterality: N/A;   KNEE SURGERY Right 2013   Current Outpatient Medications:    Albuterol Sulfate (PROAIR RESPICLICK) 108 (90 Base) MCG/ACT AEPB, Inhale 1-2 puffs into the lungs every 4 (four) hours as needed., Disp: 1 each, Rfl: 1   clotrimazole (LOTRIMIN) 1 % cream, Apply 1 Application topically 2 (two) times daily., Disp: 30 g, Rfl: 0   EPINEPHrine (EPIPEN 2-PAK) 0.3 mg/0.3 mL IJ SOAJ injection, Inject 0.3 mLs (0.3 mg total) into the muscle once., Disp: 1 Device, Rfl: 1   omeprazole (PRILOSEC) 40 MG capsule, Take 1 capsule (40 mg total) by mouth 2 (two) times daily before a meal., Disp: 60 capsule, Rfl: 3   tamsulosin (FLOMAX) 0.4 MG CAPS capsule, TAKE 1 CAPSULE BY MOUTH ONCE DAILY AFTER SUPPER, Disp: 90 capsule, Rfl: 0   triamcinolone cream (KENALOG) 0.1 %, Apply 1 application topically 2 (two) times daily., Disp: 30 g, Rfl: 0   Family History  Problem Relation Age of Onset   Bipolar disorder Mother    Hypertension Father    Cancer Father    Colon polyps Father    Leukemia Paternal Grandfather      Social History   Tobacco Use   Smoking status: Never   Smokeless tobacco: Never  Vaping Use   Vaping Use: Never used  Substance Use Topics   Alcohol use: Yes    Alcohol/week: 0.0 standard drinks of alcohol    Comment: ocassionaly   Drug use: No    Allergies as of 01/04/2022 - Review Complete 01/04/2022  Allergen Reaction Noted   Cialis [tadalafil] Swelling 03/16/2021    Review of Systems:    All systems reviewed and negative except where noted in HPI.   Physical Exam:  BP 115/68 (BP Location: Left Arm, Patient Position: Sitting, Cuff Size: Normal)   Pulse 66   Temp 97.9 F (36.6 C) (Oral)   Ht 6\' 1"  (1.854 m)    Wt 214 lb 6 oz (97.2 kg)   BMI 28.28 kg/m  No LMP for male patient.  General:   Alert,  Well-developed, well-nourished, pleasant and cooperative in NAD Head:  Normocephalic and atraumatic. Eyes:  Sclera clear, no icterus.   Conjunctiva pink. Ears:  Normal auditory acuity. Nose:  No deformity, discharge, or lesions. Mouth:  No deformity or lesions,oropharynx pink & moist. Neck:  Supple; no masses or thyromegaly. Lungs:  Respirations even and unlabored.  Clear throughout to auscultation.   No wheezes, crackles, or rhonchi. No acute distress. Heart:  Regular rate and rhythm; no murmurs, clicks, rubs, or gallops. Abdomen:  Normal bowel sounds. Soft, non-tender and non-distended without masses, hepatosplenomegaly or hernias noted.  No guarding or rebound tenderness.   Rectal: Prolapsed right posterior hemorrhoid, manually reduced, normal perianal skin, nontender digital rectal exam Msk:  Symmetrical without gross deformities. Good, equal movement & strength bilaterally. Pulses:  Normal pulses noted. Extremities:  No clubbing or edema.  No cyanosis. Neurologic:  Alert and oriented x3;  grossly normal neurologically. Skin:  Intact without significant lesions or rashes. No jaundice. Psych:  Alert and cooperative. Normal mood and affect.  Imaging Studies: No abdominal imaging  Assessment and Plan:  Noha Krivanek Lamb is a 61 y.o. 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 twice daily before meals Discussed about 6 food/4 food illumination diet, information provided Patient has canceled 46-month follow-up EGD and would like to postpone it for few more months and would like to continue omeprazole. Advised him that he can continue omeprazole for 6 months and he should consider undergoing follow-up EGD to assess response to  therapy.  Grade III hemorrhoids I have discussed about outpatient hemorrhoid ligation, the procedure, risks and benefits Consent obtained Proceed with hemorrhoid ligation today If the hemorrhoid ligation does not work, discussed with him about surgical hemorrhoidectomy as a neck step   Follow up in 4 weeks  Cephas Darby, MD

## 2022-02-06 ENCOUNTER — Ambulatory Visit: Payer: 59 | Admitting: Gastroenterology

## 2022-02-08 ENCOUNTER — Ambulatory Visit: Payer: 59 | Admitting: Gastroenterology

## 2022-02-15 ENCOUNTER — Ambulatory Visit: Admit: 2022-02-15 | Payer: 59 | Admitting: Gastroenterology

## 2022-02-15 SURGERY — ESOPHAGOGASTRODUODENOSCOPY (EGD) WITH PROPOFOL
Anesthesia: Choice

## 2022-03-16 NOTE — Progress Notes (Unsigned)
I,Advik Weatherspoon S Edlyn Rosenburg,acting as a Education administrator for Lavon Paganini, MD.,have documented all relevant documentation on the behalf of Lavon Paganini, MD,as directed by  Lavon Paganini, MD while in the presence of Lavon Paganini, MD.    Complete physical exam   Patient: Jonathan Gallegos   DOB: 02-02-1961   61 y.o. Male  MRN: [id number] Visit Date: 03/19/2022  Today's healthcare provider: Lavon Paganini, MD   No chief complaint on file.  Subjective    Owyn Raulston Mcglocklin is a 61 y.o. male who presents today for a complete physical exam.  He reports consuming a {diet types:17450} diet. {Exercise:19826} He generally feels {well/fairly well/poorly:18703}. He reports sleeping {well/fairly well/poorly:18703}. He {does/does not:200015} have additional problems to discuss today.  HPI    Past Medical History:  Diagnosis Date   BPH (benign prostatic hyperplasia)    Past Surgical History:  Procedure Laterality Date   COLONOSCOPY WITH PROPOFOL N/A 10/05/2016   Procedure: COLONOSCOPY WITH PROPOFOL;  Surgeon: Jonathon Bellows, MD;  Location: [facility] ENDOSCOPY;  Service: Endoscopy;  Laterality: N/A;   COLONOSCOPY WITH PROPOFOL N/A 11/15/2021   Procedure: COLONOSCOPY WITH PROPOFOL;  Surgeon: Lin Landsman, MD;  Location: [facility] ENDOSCOPY;  Service: Gastroenterology;  Laterality: N/A;   ESOPHAGOGASTRODUODENOSCOPY (EGD) WITH PROPOFOL N/A 11/15/2021   Procedure: ESOPHAGOGASTRODUODENOSCOPY (EGD) WITH PROPOFOL;  Surgeon: Lin Landsman, MD;  Location: [facility] ENDOSCOPY;  Service: Gastroenterology;  Laterality: N/A;   KNEE SURGERY Right 2013   Social History   Socioeconomic History   Marital status: Married    Spouse name: Not on file   Number of children: 2   Years of education: Not on file   Highest education level: Not on file  Occupational History   Not on file  Tobacco Use   Smoking status: Never   Smokeless tobacco: Never  Vaping Use   Vaping Use: Never used  Substance and  Sexual Activity   Alcohol use: Yes    Alcohol/week: 0.0 standard drinks of alcohol    Comment: ocassionaly   Drug use: No   Sexual activity: Yes    Birth control/protection: None  Other Topics Concern   Not on file  Social History Narrative   Not on file   Social Determinants of Health   Financial Resource Strain: Not on file  Food Insecurity: Not on file  Transportation Needs: Not on file  Physical Activity: Not on file  Stress: Not on file  Social Connections: Not on file  Intimate Partner Violence: Not on file   Family Status  Relation Name Status   Mother  Deceased   Father  Deceased   PGF  Deceased at age 67   Brother  Alive   Daughter  Alive   MGF  Deceased       OLD AGE   Brother  Alive   Daughter  Alive   MGM  Deceased       cause of death: Pneumonia   PGM  Deceased       old age   Family History  Problem Relation Age of Onset   Bipolar disorder Mother    Hypertension Father    Cancer Father    Colon polyps Father    Leukemia Paternal Grandfather    Allergies  Allergen Reactions   Cialis [Tadalafil] Swelling    Patient Care Team: Virginia Crews, MD as PCP - General (Family Medicine)   Medications: Outpatient Medications Prior to Visit  Medication Sig   Albuterol Sulfate (PROAIR RESPICLICK) 440 (90  Base) MCG/ACT AEPB Inhale 1-2 puffs into the lungs every 4 (four) hours as needed.   clotrimazole (LOTRIMIN) 1 % cream Apply 1 Application topically 2 (two) times daily.   EPINEPHrine (EPIPEN 2-PAK) 0.3 mg/0.3 mL IJ SOAJ injection Inject 0.3 mLs (0.3 mg total) into the muscle once.   omeprazole (PRILOSEC) 40 MG capsule Take 1 capsule (40 mg total) by mouth 2 (two) times daily before a meal.   tamsulosin (FLOMAX) 0.4 MG CAPS capsule TAKE 1 CAPSULE BY MOUTH ONCE DAILY AFTER SUPPER   triamcinolone cream (KENALOG) 0.1 % Apply 1 application topically 2 (two) times daily.   No facility-administered medications prior to visit.    Review of Systems   All other systems reviewed and are negative.   Last CBC Lab Results  Component Value Date   WBC 4.1 01/05/2020   HGB 13.8 01/05/2020   HCT 41.5 01/05/2020   MCV 92 01/05/2020   MCH 30.5 01/05/2020   RDW 12.7 01/05/2020   PLT 220 54/65/6812   Last metabolic panel Lab Results  Component Value Date   GLUCOSE 91 03/17/2021   NA 140 03/17/2021   K 4.8 03/17/2021   CL 101 03/17/2021   CO2 25 03/17/2021   BUN 15 03/17/2021   CREATININE 0.98 03/17/2021   EGFR 89 03/17/2021   CALCIUM 9.6 03/17/2021   PROT 6.9 03/17/2021   ALBUMIN 4.5 03/17/2021   LABGLOB 2.4 03/17/2021   AGRATIO 1.9 03/17/2021   BILITOT 0.5 03/17/2021   ALKPHOS 59 03/17/2021   AST 18 03/17/2021   ALT 13 03/17/2021   ANIONGAP 6 10/31/2015   Last lipids Lab Results  Component Value Date   CHOL 227 (H) 03/17/2021   HDL 71 03/17/2021   LDLCALC 135 (H) 03/17/2021   TRIG 121 03/17/2021   CHOLHDL 3.2 03/17/2021   Last hemoglobin A1c Lab Results  Component Value Date   HGBA1C 5.6 03/17/2021   Last thyroid functions Lab Results  Component Value Date   TSH 2.160 01/05/2020      Objective    There were no vitals taken for this visit. BP Readings from Last 3 Encounters:  01/04/22 115/68  12/07/21 114/71  11/23/21 123/71   Wt Readings from Last 3 Encounters:  01/04/22 214 lb 6 oz (97.2 kg)  12/07/21 210 lb (95.3 kg)  11/23/21 211 lb 8 oz (95.9 kg)       Physical Exam  ***  Last depression screening scores    03/16/2021    4:19 PM 03/10/2020    2:16 PM 12/31/2019    2:07 PM  PHQ 2/9 Scores  PHQ - 2 Score 0 2 0  PHQ- 9 Score 2 4    Last fall risk screening    03/16/2021    4:19 PM  Fall Risk   Falls in the past year? 0  Number falls in past yr: 0  Injury with Fall? 0  Risk for fall due to : No Fall Risks   Last Audit-C alcohol use screening    03/16/2021    4:19 PM  Alcohol Use Disorder Test (AUDIT)  1. How often do you have a drink containing alcohol? 2  2. How many drinks  containing alcohol do you have on a typical day when you are drinking? 0  3. How often do you have six or more drinks on one occasion? 0  AUDIT-C Score 2   A score of 3 or more in women, and 4 or more in men indicates increased risk  for alcohol abuse, EXCEPT if all of the points are from question 1   No results found for any visits on 03/19/22.  Assessment & Plan    Routine Health Maintenance and Physical Exam  Exercise Activities and Dietary recommendations  Goals   None     Immunization History  Administered Date(s) Administered   Influenza,inj,Quad PF,6+ Mos 03/10/2020   Influenza-Unspecified 04/12/2017, 03/17/2021   Tdap 08/10/2016   Zoster Recombinat (Shingrix) 09/09/2020, 03/17/2021    Health Maintenance  Topic Date Due   COVID-19 Vaccine (1) Never done   INFLUENZA VACCINE  12/26/2021   TETANUS/TDAP  08/11/2026   COLONOSCOPY (Pts 45-20yr Insurance coverage will need to be confirmed)  11/16/2026   Hepatitis C Screening  Completed   HIV Screening  Completed   Zoster Vaccines- Shingrix  Completed   HPV VACCINES  Aged Out    Discussed health benefits of physical activity, and encouraged him to engage in regular exercise appropriate for his age and condition.  ***  No follow-ups on file.     {provider attestation***:1}   ALavon Paganini MD  [phone](phone) [phone](fax)  [place]

## 2022-03-19 ENCOUNTER — Ambulatory Visit (INDEPENDENT_AMBULATORY_CARE_PROVIDER_SITE_OTHER): Payer: 59 | Admitting: Family Medicine

## 2022-03-19 ENCOUNTER — Encounter: Payer: Self-pay | Admitting: Family Medicine

## 2022-03-19 VITALS — BP 113/75 | HR 60 | Temp 98.2°F | Resp 16 | Ht 73.0 in | Wt 211.9 lb

## 2022-03-19 DIAGNOSIS — Z0000 Encounter for general adult medical examination without abnormal findings: Secondary | ICD-10-CM | POA: Diagnosis not present

## 2022-03-19 DIAGNOSIS — E782 Mixed hyperlipidemia: Secondary | ICD-10-CM

## 2022-03-19 DIAGNOSIS — Z23 Encounter for immunization: Secondary | ICD-10-CM

## 2022-03-19 DIAGNOSIS — R7303 Prediabetes: Secondary | ICD-10-CM | POA: Diagnosis not present

## 2022-03-19 DIAGNOSIS — R351 Nocturia: Secondary | ICD-10-CM

## 2022-03-19 DIAGNOSIS — N401 Enlarged prostate with lower urinary tract symptoms: Secondary | ICD-10-CM

## 2022-03-19 NOTE — Assessment & Plan Note (Signed)
Chronic and stable Continue flomax at current dose No longer on cialis 2/2 angioedema

## 2022-03-19 NOTE — Assessment & Plan Note (Signed)
Reviewed last lipid panel Not currently on a statin Recheck FLP and CMP Discussed diet and exercise  

## 2022-03-19 NOTE — Assessment & Plan Note (Signed)
Recommend low carb diet °Recheck A1c  °

## 2022-03-21 LAB — LIPID PANEL WITH LDL/HDL RATIO
Cholesterol, Total: 265 mg/dL — ABNORMAL HIGH (ref 100–199)
HDL: 80 mg/dL (ref >39)
LDL Chol Calc (NIH): 170 mg/dL — ABNORMAL HIGH (ref 0–99)
LDL/HDL Ratio: 2.1 ratio (ref 0.0–3.6)
Triglycerides: 89 mg/dL (ref 0–149)
VLDL Cholesterol Cal: 15 mg/dL (ref 5–40)

## 2022-03-21 LAB — COMPREHENSIVE METABOLIC PANEL
ALT: 15 IU/L (ref 0–44)
AST: 20 IU/L (ref 0–40)
Albumin/Globulin Ratio: 1.8 (ref 1.2–2.2)
Albumin: 4.5 g/dL (ref 3.8–4.9)
Alkaline Phosphatase: 60 IU/L (ref 44–121)
BUN/Creatinine Ratio: 14 (ref 10–24)
BUN: 14 mg/dL (ref 8–27)
Bilirubin Total: 0.5 mg/dL (ref 0.0–1.2)
CO2: 26 mmol/L (ref 20–29)
Calcium: 9.5 mg/dL (ref 8.6–10.2)
Chloride: 100 mmol/L (ref 96–106)
Creatinine, Ser: 1 mg/dL (ref 0.76–1.27)
Globulin, Total: 2.5 g/dL (ref 1.5–4.5)
Glucose: 99 mg/dL (ref 70–99)
Potassium: 4.2 mmol/L (ref 3.5–5.2)
Sodium: 139 mmol/L (ref 134–144)
Total Protein: 7 g/dL (ref 6.0–8.5)
eGFR: 86 mL/min/{1.73_m2} (ref >59)

## 2022-03-21 LAB — CBC
Hematocrit: 42.7 % (ref 37.5–51.0)
Hemoglobin: 14.3 g/dL (ref 13.0–17.7)
MCH: 30.2 pg (ref 26.6–33.0)
MCHC: 33.5 g/dL (ref 31.5–35.7)
MCV: 90 fL (ref 79–97)
Platelets: 245 10*3/uL (ref 150–450)
RBC: 4.74 x10E6/uL (ref 4.14–5.80)
RDW: 12.8 % (ref 11.6–15.4)
WBC: 5.2 10*3/uL (ref 3.4–10.8)

## 2022-03-21 LAB — HEMOGLOBIN A1C
Est. average glucose Bld gHb Est-mCnc: 123 mg/dL
Hgb A1c MFr Bld: 5.9 % — ABNORMAL HIGH (ref 4.8–5.6)

## 2022-03-21 LAB — PSA TOTAL (REFLEX TO FREE): Prostate Specific Ag, Serum: 1.4 ng/mL (ref 0.0–4.0)

## 2022-03-21 LAB — TSH: TSH: 1.82 u[IU]/mL (ref 0.450–4.500)

## 2022-04-21 ENCOUNTER — Other Ambulatory Visit: Payer: Self-pay | Admitting: Family Medicine

## 2022-04-21 DIAGNOSIS — N401 Enlarged prostate with lower urinary tract symptoms: Secondary | ICD-10-CM

## 2022-04-23 ENCOUNTER — Other Ambulatory Visit: Payer: Self-pay | Admitting: Family Medicine

## 2022-04-23 DIAGNOSIS — N401 Enlarged prostate with lower urinary tract symptoms: Secondary | ICD-10-CM

## 2022-07-02 ENCOUNTER — Encounter: Payer: Self-pay | Admitting: Family Medicine

## 2022-07-05 ENCOUNTER — Ambulatory Visit (INDEPENDENT_AMBULATORY_CARE_PROVIDER_SITE_OTHER): Payer: 59 | Admitting: Family Medicine

## 2022-07-05 ENCOUNTER — Ambulatory Visit: Payer: 59 | Attending: Family Medicine

## 2022-07-05 ENCOUNTER — Encounter: Payer: Self-pay | Admitting: Family Medicine

## 2022-07-05 VITALS — BP 104/68 | HR 60 | Temp 98.1°F | Resp 16 | Ht 73.0 in | Wt 215.8 lb

## 2022-07-05 DIAGNOSIS — R42 Dizziness and giddiness: Secondary | ICD-10-CM

## 2022-07-05 DIAGNOSIS — G478 Other sleep disorders: Secondary | ICD-10-CM | POA: Diagnosis not present

## 2022-07-05 DIAGNOSIS — R400 Somnolence: Secondary | ICD-10-CM

## 2022-07-05 DIAGNOSIS — R0609 Other forms of dyspnea: Secondary | ICD-10-CM

## 2022-07-05 DIAGNOSIS — R0683 Snoring: Secondary | ICD-10-CM

## 2022-07-05 NOTE — Progress Notes (Signed)
I,Sulibeya S Dimas,acting as a Education administrator for Lavon Paganini, MD.,have documented all relevant documentation on the behalf of Lavon Paganini, MD,as directed by  Lavon Paganini, MD while in the presence of Lavon Paganini, MD.     Established patient visit   Patient: Jonathan Gallegos   DOB: [birth year]   62 y.o. Male  MRN: [id number] Visit Date: 07/05/2022  Today's healthcare provider: Lavon Paganini, MD   Chief Complaint  Patient presents with   Fatigue   Subjective    HPI  Patient requesting sleep study. He reports snoring and feeling tired. Over the last 10 years, getting worse. Non-restorative sleep.  Noticed apneic episodes.  Patient c/o of dizziness on and off since having flu in December. Spells with SOB associated, worse with exertion.  Worried about afib possibility. Hasn't noticed palpitations.   Medications: Outpatient Medications Prior to Visit  Medication Sig   Albuterol Sulfate (PROAIR RESPICLICK) 123XX123 (90 Base) MCG/ACT AEPB Inhale 1-2 puffs into the lungs every 4 (four) hours as needed.   clotrimazole (LOTRIMIN) 1 % cream Apply 1 Application topically 2 (two) times daily.   EPINEPHrine (EPIPEN 2-PAK) 0.3 mg/0.3 mL IJ SOAJ injection Inject 0.3 mLs (0.3 mg total) into the muscle once.   omeprazole (PRILOSEC) 40 MG capsule Take 1 capsule (40 mg total) by mouth 2 (two) times daily before a meal.   tamsulosin (FLOMAX) 0.4 MG CAPS capsule TAKE 1 CAPSULE BY MOUTH ONCE DAILY AFTER SUPPER   triamcinolone cream (KENALOG) 0.1 % Apply 1 application topically 2 (two) times daily.   No facility-administered medications prior to visit.    Review of Systems  Constitutional:  Positive for fatigue. Negative for chills and fever.  HENT:  Positive for congestion.   Respiratory:  Positive for shortness of breath. Negative for cough and chest tightness.   Cardiovascular:  Negative for chest pain and palpitations.  Gastrointestinal:  Negative for abdominal pain and  nausea.  Neurological:  Positive for dizziness and light-headedness. Negative for headaches.  Psychiatric/Behavioral:  Positive for sleep disturbance.        Objective    BP 104/68 (BP Location: Left Arm, Patient Position: Sitting, Cuff Size: Large)   Pulse 60   Temp 98.1 F (36.7 C)   Resp 16   Ht 6' 1"$  (1.854 m)   Wt 215 lb 12.8 oz (97.9 kg)   SpO2 96%   BMI 28.47 kg/m    Orthostatic Vitals for the past 48 hrs (Last 6 readings):  Patient Position Orthostatic BP Orthostatic Pulse  07/05/22 1609 Sitting -- --  07/05/22 1638 Supine 110/71 59  07/05/22 1639 Sitting 109/72 60  07/05/22 1640 Standing 111/71 64     Physical Exam Vitals reviewed.  Constitutional:      General: He is not in acute distress.    Appearance: Normal appearance. He is not diaphoretic.  HENT:     Head: Normocephalic and atraumatic.  Eyes:     General: No scleral icterus.    Conjunctiva/sclera: Conjunctivae normal.  Cardiovascular:     Rate and Rhythm: Normal rate and regular rhythm.     Pulses: Normal pulses.     Heart sounds: Normal heart sounds. No murmur heard. Pulmonary:     Effort: Pulmonary effort is normal. No respiratory distress.     Breath sounds: Normal breath sounds. No wheezing or rhonchi.  Musculoskeletal:     Cervical back: Neck supple.     Right lower leg: No edema.     Left lower leg: No  edema.  Lymphadenopathy:     Cervical: No cervical adenopathy.  Skin:    General: Skin is warm and dry.     Findings: No rash.  Neurological:     Mental Status: He is alert and oriented to person, place, and time. Mental status is at baseline.  Psychiatric:        Mood and Affect: Mood normal.        Behavior: Behavior normal.       No results found for any visits on 07/05/22.  Assessment & Plan     Problem List Items Addressed This Visit       Other   Dyspnea on exertion    May be post-COVID changes, as it did start abruptly after illness Always need to consider anginal  equivalent Will start with Zio patch x 14 days to rule out arrhythmia If that is normal and symptoms persist, consider cardiac workup      Relevant Orders   LONG TERM MONITOR (3-14 DAYS)   Lightheadedness    Blood pressure is low normal, but not orthostatic Consider this in the setting of dyspnea on exertion it may be post-COVID changes versus anginal equivalent versus arrhythmia Patient is not having any chest pain and this started abruptly after illness, so we will hold off on anginal workup In normal sinus rhythm today, but may be having episodes of arrhythmia such as A-fib Zio patch x 14 days      Relevant Orders   LONG TERM MONITOR (3-14 DAYS)   Snoring - Primary    Longstanding issue Concerning for possible OSA in the setting of nonrestorative sleep and daytime drowsiness Home sleep study ordered CPAP or other management pending results      Relevant Orders   Ambulatory referral to Sleep Studies   Other Visit Diagnoses     Non-restorative sleep       Relevant Orders   Ambulatory referral to Sleep Studies   Has daytime drowsiness       Relevant Orders   Ambulatory referral to Sleep Studies        Return if symptoms worsen or fail to improve.      I, Lavon Paganini, MD, have reviewed all documentation for this visit. The documentation on 07/06/22 for the exam, diagnosis, procedures, and orders are all accurate and complete.   Kaitlyn Franko, Dionne Bucy, MD, MPH [facility]

## 2022-07-06 NOTE — Assessment & Plan Note (Signed)
Blood pressure is low normal, but not orthostatic Consider this in the setting of dyspnea on exertion it may be post-COVID changes versus anginal equivalent versus arrhythmia Patient is not having any chest pain and this started abruptly after illness, so we will hold off on anginal workup In normal sinus rhythm today, but may be having episodes of arrhythmia such as A-fib Zio patch x 14 days

## 2022-07-06 NOTE — Assessment & Plan Note (Signed)
May be post-COVID changes, as it did start abruptly after illness Always need to consider anginal equivalent Will start with Zio patch x 14 days to rule out arrhythmia If that is normal and symptoms persist, consider cardiac workup

## 2022-07-06 NOTE — Assessment & Plan Note (Signed)
Longstanding issue Concerning for possible OSA in the setting of nonrestorative sleep and daytime drowsiness Home sleep study ordered CPAP or other management pending results

## 2022-07-11 DIAGNOSIS — R0609 Other forms of dyspnea: Secondary | ICD-10-CM

## 2022-07-11 DIAGNOSIS — R42 Dizziness and giddiness: Secondary | ICD-10-CM

## 2022-07-25 ENCOUNTER — Other Ambulatory Visit: Payer: Self-pay | Admitting: Family Medicine

## 2022-07-25 DIAGNOSIS — N401 Enlarged prostate with lower urinary tract symptoms: Secondary | ICD-10-CM

## 2022-07-26 NOTE — Telephone Encounter (Signed)
Requested Prescriptions  Pending Prescriptions Disp Refills   tamsulosin (FLOMAX) 0.4 MG CAPS capsule [Pharmacy Med Name: Tamsulosin HCl 0.4 MG Oral Capsule] 90 capsule 2    Sig: TAKE 1 CAPSULE BY MOUTH ONCE DAILY AFTER SUPPER     Urology: Alpha-Adrenergic Blocker Passed - 07/25/2022 12:49 PM      Passed - PSA in normal range and within 360 days    PSA  Date Value Ref Range Status  11/05/2013 1.0  Final   Prostate Specific Ag, Serum  Date Value Ref Range Status  03/20/2022 1.4 0.0 - 4.0 ng/mL Final    Comment:    Roche ECLIA methodology. According to the American Urological Association, Serum PSA should decrease and remain at undetectable levels after radical prostatectomy. The AUA defines biochemical recurrence as an initial PSA value 0.2 ng/mL or greater followed by a subsequent confirmatory PSA value 0.2 ng/mL or greater. Values obtained with different assay methods or kits cannot be used interchangeably. Results cannot be interpreted as absolute evidence of the presence or absence of malignant disease.          Passed - Last BP in normal range    BP Readings from Last 1 Encounters:  07/05/22 104/68         Passed - Valid encounter within last 12 months    Recent Outpatient Visits           3 weeks ago [place], Dionne Bucy, MD   4 months ago Encounter for annual physical exam   [facility] [place], Dionne Bucy, MD   1 year ago Encounter for annual physical exam   [place], Dionne Bucy, MD   1 year ago Chronic fatigue   [place], Laurita Quint, [place]   2 years ago [place], [place], [place]       Future Appointments             In 7 months [place], Dionne Bucy, MD [facility], [facility]

## 2022-07-31 ENCOUNTER — Telehealth: Payer: Self-pay | Admitting: Family Medicine

## 2022-07-31 DIAGNOSIS — G4737 Central sleep apnea in conditions classified elsewhere: Secondary | ICD-10-CM

## 2022-07-31 NOTE — Telephone Encounter (Signed)
Sleep study results  Patient's home sleep study results from service date 07/13/2022 are received and reviewed today.  The study shows evidence of mild sleep apnea with an AHI of 14.2/h.  This means that each hour he stopped breathing or his oxygen level dropped an average of 14 times.  70% of his episodes were categorized as central or mixed rather than obstructive, however.  This shows that he could have central sleep apnea rather than obstructive sleep apnea which is the typical type.  I recommend referral to neurology for further evaluation. Referral placed. Please let patient know.  Anchor Dwan, Dionne Bucy, MD, MPH [facility]

## 2022-07-31 NOTE — Telephone Encounter (Signed)
Message sent to patient through my chart.  

## 2022-08-30 ENCOUNTER — Other Ambulatory Visit: Payer: Self-pay | Admitting: Family Medicine

## 2022-08-30 DIAGNOSIS — G4737 Central sleep apnea in conditions classified elsewhere: Secondary | ICD-10-CM

## 2022-08-30 NOTE — Telephone Encounter (Signed)
Looks like his referral was denied by Neurology as they thought it was for sleep study.  I want him to see a neurologist about the results, not have another sleep study. Can this please be clarified and the referral fixed so he can get an appointment?

## 2022-10-02 ENCOUNTER — Encounter: Payer: Self-pay | Admitting: Nurse Practitioner

## 2022-10-02 ENCOUNTER — Ambulatory Visit (INDEPENDENT_AMBULATORY_CARE_PROVIDER_SITE_OTHER): Payer: 59 | Admitting: Nurse Practitioner

## 2022-10-02 VITALS — BP 120/70 | HR 81 | Temp 97.8°F | Ht 73.0 in | Wt 216.0 lb

## 2022-10-02 DIAGNOSIS — G4739 Other sleep apnea: Secondary | ICD-10-CM | POA: Diagnosis not present

## 2022-10-02 DIAGNOSIS — J309 Allergic rhinitis, unspecified: Secondary | ICD-10-CM | POA: Insufficient documentation

## 2022-10-02 DIAGNOSIS — J302 Other seasonal allergic rhinitis: Secondary | ICD-10-CM | POA: Diagnosis not present

## 2022-10-02 DIAGNOSIS — R42 Dizziness and giddiness: Secondary | ICD-10-CM | POA: Insufficient documentation

## 2022-10-02 NOTE — Assessment & Plan Note (Signed)
Intermittent episodes of dizziness. No other cardiac symptoms. Evaluation with PCP revealed nl lab values. 14 day holter monitor showed 2 short runs of SVT, no longer than 6 seconds each. May need to consider echocardiogram. Possible it is also secondary to inner ear given his history. He will follow up with his PCP as scheduled.

## 2022-10-02 NOTE — Patient Instructions (Signed)
Your home sleep study showed moderate mixed obstructive and central sleep apnea. You will need an in lab CPAP titration study to determine if you are well controlled on CPAP or if you need more support with a BiPAP. Someone will contact you to schedule this.  We discussed how untreated sleep apnea puts an individual at risk for cardiac arrhthymias, pulm HTN, DM, stroke and increases their risk for daytime accidents. We also briefly reviewed treatment options including weight loss, side sleeping position, oral appliance, CPAP therapy or referral to ENT for possible surgical options  Recommend trying a daily allergy pill such as claritin, zyrtec, xyzal or allegra. You can use the store brand as well. Avoid any of the decongestant versions, typically labeled as Zyrtec D or Claritin D, etc. As these can increase your blood pressure and heart rate Try flonase nasal spray or nasocort over there counter for nasal congestion  Follow up in 4-5 weeks with Dr. Craige Gallegos or Jonathan Addison Aston Lieske,NP to go over sleep study results. If symptoms worsen, please contact office for sooner follow up or seek emergency care.

## 2022-10-02 NOTE — Assessment & Plan Note (Signed)
Mixed central/obstructive sleep apnea with total AHI 14.2 and central/mixed index 9.9. No cardiac history or history of stroke. No sedating medications to explain central events. He is symptomatic with symptoms of daytime sleepiness, restless sleep, nocturia, and has associated snoring. Recommend he undergo CPAP titration for further evaluation as he may need BiPAP given central events and risk for emergence with higher CPAP settings. Orders placed today. Cautioned on safe driving practices.  Patient Instructions  Your home sleep study showed moderate mixed obstructive and central sleep apnea. You will need an in lab CPAP titration study to determine if you are well controlled on CPAP or if you need more support with a BiPAP. Someone will contact you to schedule this.  We discussed how untreated sleep apnea puts an individual at risk for cardiac arrhthymias, pulm HTN, DM, stroke and increases their risk for daytime accidents. We also briefly reviewed treatment options including weight loss, side sleeping position, oral appliance, CPAP therapy or referral to ENT for possible surgical options  Recommend trying a daily allergy pill such as claritin, zyrtec, xyzal or allegra. You can use the store brand as well. Avoid any of the decongestant versions, typically labeled as Zyrtec D or Claritin D, etc. As these can increase your blood pressure and heart rate Try flonase nasal spray or nasocort over there counter for nasal congestion  Follow up in 4-5 weeks with Dr. Craige Cotta or Florentina Addison Chistopher Mangino,NP to go over sleep study results. If symptoms worsen, please contact office for sooner follow up or seek emergency care.

## 2022-10-02 NOTE — Progress Notes (Unsigned)
@Patient  ID: Jonathan Gallegos, male    DOB: 23-Aug-1960, 62 y.o.   MRN: [id number]  Chief Complaint  Patient presents with   sleep consult    Prior sleep study- no cpap.  Daytime sleepiness and loud snoring.     Referring provider: Erasmo Downer, MD  HPI: 62 year old male, never smoker referred for sleep consult.  Past medical history significant for esophagitis, eczema, conductive hearing loss of left ear, BPH with nocturia, prediabetes, HLD.  TEST/EVENTS:  07/13/2022 HST: AHI 14.2, central/mixed index 9.9, SpO2 low 88%  10/02/2022: Today-sleep consult Patient presents today for sleep consult, referred by Dr. Beryle Flock.  He has been having trouble with his sleep for many years now.  Feels like its gotten worse over the last couple.  His wife is noticing that he is snoring really loudly if he falls asleep on the couch.  He feels very tired during the day.  Wakes up gasping for air at night sometimes.  Tends to come home and falls asleep as soon as he lays down In the evenings.  He does have trouble with dry mouth when he wakes up.  He is frequently drinking water.  Does have some trouble with nocturia.  He has had some drowsy driving with longer distances but no problems driving around town or to and from work.  Never fallen asleep while driving.  Denies any morning headaches, sleep parasomnia/paralysis.  No history of narcolepsy or symptoms of cataplexy.  He does have some nasal congestion, worse during allergy season.  He is not sure if this contributes to his nighttime snoring or symptoms.  Only have shortness of breath with strenuous activity otherwise breathing does not bother him.  He has been worked up by his PCP for episodes of dizziness.  Wore Holter monitor without any significant findings from what he was told.  Denies any palpitations, chest pain, orthopnea, PND, leg swelling. He goes to bed around 11 PM.  Falls asleep quickly, usually within 10 minutes.  Wakes 2-3 times a  night to use the restroom.  Gets up around 6 AM.  Does not operate any heavy machinery in his job field.  Weight has been stable over the last 2 years.  He had a recent home sleep study, ordered by his PCP.  This was completed 07/13/2022 with AHI 14.2.  He did also have a significant amount of central/mixed apneas.  His CAI was 9.9.  Never started on any sort of therapy.  Does not take any sedating medications.  No history of cardiac disease or stroke. Never smoker. Drinks 1-2 alcoholic drinks a week. Around 3-4 cups of coffee a day, sometimes more. No sleep aids. Lives with his wife. He has adult children. Works as an Art gallery manager. No significant family history.  [place] 10  Allergies  Allergen Reactions   Cialis [Tadalafil] Swelling    Immunization History  Administered Date(s) Administered   Influenza,inj,Quad PF,6+ Mos 03/10/2020, 03/19/2022   Influenza-Unspecified 04/12/2017, 03/17/2021   Tdap 08/10/2016   Zoster Recombinat (Shingrix) 09/09/2020, 03/17/2021    Past Medical History:  Diagnosis Date   BPH (benign prostatic hyperplasia)     Tobacco History: Social History   Tobacco Use  Smoking Status Never  Smokeless Tobacco Never   Counseling given: Not Answered   Outpatient Medications Prior to Visit  Medication Sig Dispense Refill   Albuterol Sulfate (PROAIR RESPICLICK) 108 (90 Base) MCG/ACT AEPB Inhale 1-2 puffs into the lungs every 4 (four) hours as needed. 1  each 1   clotrimazole (LOTRIMIN) 1 % cream Apply 1 Application topically 2 (two) times daily. 30 g 0   EPINEPHrine (EPIPEN 2-PAK) 0.3 mg/0.3 mL IJ SOAJ injection Inject 0.3 mLs (0.3 mg total) into the muscle once. 1 Device 1   tamsulosin (FLOMAX) 0.4 MG CAPS capsule TAKE 1 CAPSULE BY MOUTH ONCE DAILY AFTER SUPPER 90 capsule 2   triamcinolone cream (KENALOG) 0.1 % Apply 1 application topically 2 (two) times daily. 30 g 0   omeprazole (PRILOSEC) 40 MG capsule Take 1 capsule (40 mg total) by mouth 2 (two) times daily  before a meal. 60 capsule 3   No facility-administered medications prior to visit.     Review of Systems:   Constitutional: No weight loss or gain, night sweats, fevers, chills, or lassitude. +daytime fatigue  HEENT: No headaches, difficulty swallowing, tooth/dental problems, or sore throat. No itching, ear ache. +dry mouth, nasal congestion, sneezing CV:  +occasional dizziness. No chest pain, orthopnea, PND, swelling in lower extremities, anasarca, palpitations, syncope Resp: +snoring, shortness of breath with strenuous exertion. No excess mucus or change in color of mucus. No productive or non-productive. No hemoptysis. No wheezing.  No chest wall deformity GI:  No heartburn, indigestion, abdominal pain, nausea, vomiting, diarrhea, change in bowel habits, loss of appetite, bloody stools.  GU: No dysuria, change in color of urine, urgency or frequency.  No flank pain, no hematuria  Skin: No rash, lesions, ulcerations MSK:  No joint pain or swelling.   Neuro: No dizziness or lightheadedness.  Psych: No depression or anxiety. Mood stable. +sleep disturbance    Physical Exam:  BP 120/70 (BP Location: Left Arm, Cuff Size: Normal)   Pulse 81   Temp 97.8 F (36.6 C) (Temporal)   Ht 6\' 1"  (1.854 m)   Wt 216 lb (98 kg)   SpO2 98%   BMI 28.50 kg/m   GEN: Pleasant, interactive, well-appearing; in no acute distress HEENT:  Normocephalic and atraumatic. PERRLA. Sclera white. Nasal turbinates boggy, moist and patent bilaterally. No rhinorrhea present. Oropharynx pink and moist, without exudate or edema. No lesions, ulcerations, or postnasal drip. Mallampati III NECK:  Supple w/ fair ROM. No JVD present. Normal carotid impulses w/o bruits. Thyroid symmetrical with no goiter or nodules palpated. No lymphadenopathy.   CV: RRR, no m/r/g, no peripheral edema. Pulses intact, +2 bilaterally. No cyanosis, pallor or clubbing. PULMONARY:  Unlabored, regular breathing. Clear bilaterally A&P w/o  wheezes/rales/rhonchi. No accessory muscle use.  GI: BS present and normoactive. Soft, non-tender to palpation. No organomegaly or masses detected.  MSK: No erythema, warmth or tenderness. Cap refil <2 sec all extrem. No deformities or joint swelling noted.  Neuro: A/Ox3. No focal deficits noted.   Skin: Warm, no lesions or rashe Psych: Normal affect and behavior. Judgement and thought content appropriate.     Lab Results:  CBC    Component Value Date/Time   WBC 5.2 03/20/2022 0833   WBC 7.6 10/31/2015 0605   RBC 4.74 03/20/2022 0833   RBC 4.87 10/31/2015 0605   HGB 14.3 03/20/2022 0833   HCT 42.7 03/20/2022 0833   PLT 245 03/20/2022 0833   MCV 90 03/20/2022 0833   MCH 30.2 03/20/2022 0833   MCH 30.2 10/31/2015 0605   MCHC 33.5 03/20/2022 0833   MCHC 34.2 10/31/2015 0605   RDW 12.8 03/20/2022 0833   LYMPHSABS 1.6 01/07/2019 0848   MONOABS 0.7 10/31/2015 0605   EOSABS 0.3 01/07/2019 0848   BASOSABS 0.1 01/07/2019 0848  BMET    Component Value Date/Time   NA 139 03/20/2022 0833   K 4.2 03/20/2022 0833   CL 100 03/20/2022 0833   CO2 26 03/20/2022 0833   GLUCOSE 99 03/20/2022 0833   GLUCOSE 98 10/31/2015 0605   BUN 14 03/20/2022 0833   CREATININE 1.00 03/20/2022 0833   CALCIUM 9.5 03/20/2022 0833   GFRNONAA 94 01/05/2020 0815   GFRAA 109 01/05/2020 0815    BNP No results found for: "BNP"   Imaging:  No results found.        No data to display          No results found for: "NITRICOXIDE"      Assessment & Plan:   Mixed sleep apnea Mixed central/obstructive sleep apnea with total AHI 14.2 and central/mixed index 9.9. No cardiac history or history of stroke. No sedating medications to explain central events. He is symptomatic with symptoms of daytime sleepiness, restless sleep, nocturia, and has associated snoring. Recommend he undergo CPAP titration for further evaluation as he may need BiPAP given central events and risk for emergence with  higher CPAP settings. Orders placed today. Cautioned on safe driving practices.  Patient Instructions  Your home sleep study showed moderate mixed obstructive and central sleep apnea. You will need an in lab CPAP titration study to determine if you are well controlled on CPAP or if you need more support with a BiPAP. Someone will contact you to schedule this.  We discussed how untreated sleep apnea puts an individual at risk for cardiac arrhthymias, pulm HTN, DM, stroke and increases their risk for daytime accidents. We also briefly reviewed treatment options including weight loss, side sleeping position, oral appliance, CPAP therapy or referral to ENT for possible surgical options  Recommend trying a daily allergy pill such as claritin, zyrtec, xyzal or allegra. You can use the store brand as well. Avoid any of the decongestant versions, typically labeled as Zyrtec D or Claritin D, etc. As these can increase your blood pressure and heart rate Try flonase nasal spray or nasocort over there counter for nasal congestion  Follow up in 4-5 weeks with Dr. Craige Cotta or Florentina Addison Penney Domanski,NP to go over sleep study results. If symptoms worsen, please contact office for sooner follow up or seek emergency care.    Allergic rhinitis Recommended he start daily antihistamine and intranasal steroid.   Dizziness Intermittent episodes of dizziness. No other cardiac symptoms. Evaluation with PCP revealed nl lab values. 14 day holter monitor showed 2 short runs of SVT, no longer than 6 seconds each. May need to consider echocardiogram. Possible it is also secondary to inner ear given his history. He will follow up with his PCP as scheduled.   I spent 45 minutes of dedicated to the care of this patient on the date of this encounter to include pre-visit review of records, face-to-face time with the patient discussing conditions above, post visit ordering of testing, clinical documentation with the electronic health record,  making appropriate referrals as documented, and communicating necessary findings to members of the patients care team.  Noemi Chapel, NP 10/02/2022  Pt aware and understands NP's role.

## 2022-10-02 NOTE — Assessment & Plan Note (Signed)
Recommended he start daily antihistamine and intranasal steroid.

## 2022-10-04 NOTE — Progress Notes (Signed)
Reviewed and agree with assessment/plan.   Youcef Klas, MD [facility] 10/04/2022, 10:47 AM Pager:  [phone]  

## 2022-10-31 ENCOUNTER — Telehealth: Payer: Self-pay | Admitting: Nurse Practitioner

## 2022-10-31 NOTE — Telephone Encounter (Signed)
We received a note from Cindee with [facility] the patient is declining to schedule Cpap Study at this time

## 2022-10-31 NOTE — Telephone Encounter (Signed)
Jonathan Gallegos, can we call the pt to follow up on this? When we discussed it at his visit, he was agreeable to the in lab study. Thanks.

## 2022-11-01 NOTE — Telephone Encounter (Signed)
Spoke to patient.  He stated that he gave it some thought and he would like to hold of sleep study for now. He will call back if it wishes to proceed with it in the future.   Routing to [employer] as an [employer]

## 2022-11-13 ENCOUNTER — Ambulatory Visit: Payer: 59 | Admitting: Nurse Practitioner

## 2022-12-24 ENCOUNTER — Telehealth: Payer: Self-pay | Admitting: Family Medicine

## 2023-01-07 ENCOUNTER — Encounter: Payer: Self-pay | Admitting: Family Medicine

## 2023-01-17 ENCOUNTER — Encounter: Payer: Self-pay | Admitting: Family Medicine

## 2023-01-17 ENCOUNTER — Ambulatory Visit (INDEPENDENT_AMBULATORY_CARE_PROVIDER_SITE_OTHER): Payer: 59 | Admitting: Family Medicine

## 2023-01-17 VITALS — BP 108/74 | HR 56 | Temp 97.6°F | Resp 12 | Ht 73.0 in | Wt 216.4 lb

## 2023-01-17 DIAGNOSIS — R351 Nocturia: Secondary | ICD-10-CM

## 2023-01-17 DIAGNOSIS — N401 Enlarged prostate with lower urinary tract symptoms: Secondary | ICD-10-CM | POA: Diagnosis not present

## 2023-01-17 DIAGNOSIS — R7303 Prediabetes: Secondary | ICD-10-CM | POA: Diagnosis not present

## 2023-01-17 DIAGNOSIS — R21 Rash and other nonspecific skin eruption: Secondary | ICD-10-CM

## 2023-01-17 DIAGNOSIS — E782 Mixed hyperlipidemia: Secondary | ICD-10-CM

## 2023-01-17 MED ORDER — TRIAMCINOLONE ACETONIDE 0.1 % EX CREA: 1.0000 | TOPICAL_CREAM | Freq: Two times a day (BID) | CUTANEOUS | 2 refills | Status: AC

## 2023-01-17 NOTE — Assessment & Plan Note (Signed)
Stable on Flomax, reports good urinary flow. -Continue Flomax as prescribed.

## 2023-01-17 NOTE — Progress Notes (Signed)
Established Patient Office Visit  Subjective   Patient ID: Jonathan Gallegos, male    DOB: Jan 13, 1961  Age: 62 y.o. MRN: [id number]  Chief Complaint  Patient presents with   Follow-up    HPI  Discussed the use of AI scribe software for clinical note transcription with the patient, who gave verbal consent to proceed.  History of Present Illness   The patient, with a history of prediabetes, hyperlipidemia, and benign prostatic hyperplasia, presents for a routine physical. However, the physical was scheduled too early and the patient opted to convert the appointment to a follow-up for his chronic conditions. He also has a history of eczema, which has not been bothersome recently, but he is running low on his topical cream. He has not pursued treatment for a previously diagnosed sleep disorder, despite a home sleep study and consultation with a specialist. He also reports a recent episode of lightheadedness, which he attributes to possible blood pressure changes or exertion from yard work.         ROS    Objective:     BP 108/74 (BP Location: Left Arm, Patient Position: Sitting, Cuff Size: Large)   Pulse (!) 56   Temp 97.6 F (36.4 C) (Temporal)   Resp 12   Ht 6\' 1"  (1.854 m)   Wt 216 lb 6.4 oz (98.2 kg)   SpO2 98%   BMI 28.55 kg/m    Physical Exam Vitals reviewed.  Constitutional:      General: He is not in acute distress.    Appearance: Normal appearance. He is not diaphoretic.  HENT:     Head: Normocephalic and atraumatic.  Eyes:     General: No scleral icterus.    Conjunctiva/sclera: Conjunctivae normal.  Cardiovascular:     Rate and Rhythm: Normal rate and regular rhythm.     Heart sounds: Normal heart sounds. No murmur heard. Pulmonary:     Effort: Pulmonary effort is normal. No respiratory distress.     Breath sounds: Normal breath sounds. No wheezing or rhonchi.  Musculoskeletal:     Cervical back: Neck supple.     Right lower leg: No edema.     Left  lower leg: No edema.  Lymphadenopathy:     Cervical: No cervical adenopathy.  Skin:    General: Skin is warm and dry.     Findings: No rash.  Neurological:     Mental Status: He is alert and oriented to person, place, and time. Mental status is at baseline.  Psychiatric:        Mood and Affect: Mood normal.        Behavior: Behavior normal.      No results found for any visits on 01/17/23.    The 10-year ASCVD risk score (Arnett DK, et al., 2019) is: 6.5%    Assessment & Plan:   Problem List Items Addressed This Visit       Other   BPH associated with nocturia    Stable on Flomax, reports good urinary flow. -Continue Flomax as prescribed.      Prediabetes - Primary    Stable, no new symptoms reported. -Order labs including A1c, kidney and liver function, and cholesterol.      Relevant Orders   Hemoglobin A1c   Moderate mixed hyperlipidemia not requiring statin therapy    Stable, no new symptoms reported. -Order cholesterol labs. Not on statin      Relevant Orders   Comprehensive metabolic panel   Lipid Panel  With LDL/HDL Ratio   Other Visit Diagnoses     Rash       Relevant Medications   triamcinolone cream (KENALOG) 0.1 %          Eczema No current flare, but running low on cream. -Send refill for eczema cream to [facility].  General Health Maintenance -Reschedule physical for early 2025. -Order labs for health form, excluding PSA which will be done at physical. -Results will be communicated when available. -Schedule follow-up appointment for January 2025.        Return in about 5 months (around 06/19/2023) for CPE.    Shirlee Latch, MD

## 2023-01-17 NOTE — Assessment & Plan Note (Signed)
Stable, no new symptoms reported. -Order cholesterol labs. Not on statin

## 2023-01-17 NOTE — Assessment & Plan Note (Signed)
Stable, no new symptoms reported. -Order labs including A1c, kidney and liver function, and cholesterol.

## 2023-01-18 LAB — LIPID PANEL WITH LDL/HDL RATIO
Cholesterol, Total: 271 mg/dL — ABNORMAL HIGH (ref 100–199)
HDL: 83 mg/dL (ref >39)
LDL Chol Calc (NIH): 167 mg/dL — ABNORMAL HIGH (ref 0–99)
LDL/HDL Ratio: 2 ratio (ref 0.0–3.6)
Triglycerides: 124 mg/dL (ref 0–149)
VLDL Cholesterol Cal: 21 mg/dL (ref 5–40)

## 2023-01-18 LAB — COMPREHENSIVE METABOLIC PANEL
ALT: 12 IU/L (ref 0–44)
AST: 25 IU/L (ref 0–40)
Albumin: 4.8 g/dL (ref 3.9–4.9)
Alkaline Phosphatase: 60 IU/L (ref 44–121)
BUN/Creatinine Ratio: 14 (ref 10–24)
BUN: 14 mg/dL (ref 8–27)
Bilirubin Total: 0.3 mg/dL (ref 0.0–1.2)
CO2: 27 mmol/L (ref 20–29)
Calcium: 10 mg/dL (ref 8.6–10.2)
Chloride: 99 mmol/L (ref 96–106)
Creatinine, Ser: 1 mg/dL (ref 0.76–1.27)
Globulin, Total: 2.4 g/dL (ref 1.5–4.5)
Glucose: 96 mg/dL (ref 70–99)
Potassium: 4.4 mmol/L (ref 3.5–5.2)
Sodium: 139 mmol/L (ref 134–144)
Total Protein: 7.2 g/dL (ref 6.0–8.5)
eGFR: 86 mL/min/{1.73_m2} (ref >59)

## 2023-01-18 LAB — HEMOGLOBIN A1C
Est. average glucose Bld gHb Est-mCnc: 126 mg/dL
Hgb A1c MFr Bld: 6 % — ABNORMAL HIGH (ref 4.8–5.6)

## 2023-03-21 ENCOUNTER — Encounter: Payer: 59 | Admitting: Family Medicine

## 2023-04-20 ENCOUNTER — Other Ambulatory Visit: Payer: Self-pay | Admitting: Family Medicine

## 2023-04-20 DIAGNOSIS — N401 Enlarged prostate with lower urinary tract symptoms: Secondary | ICD-10-CM

## 2023-04-22 NOTE — Telephone Encounter (Signed)
Requested medications are due for refill today.  yes  Requested medications are on the active medications list.  yes  Last refill. 07/26/2022 #90 2 rf  Future visit scheduled.   yes  Notes to clinic.  Expired labs.    Requested Prescriptions  Pending Prescriptions Disp Refills   tamsulosin (FLOMAX) 0.4 MG CAPS capsule [Pharmacy Med Name: Tamsulosin HCl 0.4 MG Oral Capsule] 90 capsule 0    Sig: TAKE 1 CAPSULE BY MOUTH ONCE DAILY AFTER SUPPER     Urology: Alpha-Adrenergic Blocker Failed - 04/20/2023 10:35 AM      Failed - PSA in normal range and within 360 days    PSA  Date Value Ref Range Status  11/05/2013 1.0  Final   Prostate Specific Ag, Serum  Date Value Ref Range Status  03/20/2022 1.4 0.0 - 4.0 ng/mL Final    Comment:    Roche ECLIA methodology. According to the American Urological Association, Serum PSA should decrease and remain at undetectable levels after radical prostatectomy. The AUA defines biochemical recurrence as an initial PSA value 0.2 ng/mL or greater followed by a subsequent confirmatory PSA value 0.2 ng/mL or greater. Values obtained with different assay methods or kits cannot be used interchangeably. Results cannot be interpreted as absolute evidence of the presence or absence of malignant disease.          Passed - Last BP in normal range    BP Readings from Last 1 Encounters:  01/17/23 108/74         Passed - Valid encounter within last 12 months    Recent Outpatient Visits           3 months ago Prediabetes   [place] [facility] [place], Marzella Schlein, MD   9 months ago Snoring   [place] [facility] [place], Marzella Schlein, MD   1 year ago Encounter for annual physical exam   [place] [facility] [place], Marzella Schlein, MD   2 years ago Encounter for annual physical exam   [place] [facility] [place], Marzella Schlein, MD   2 years ago Chronic fatigue   [place]  [facility] [place], Azalee Course, FNP       Future Appointments             In 2 months [place], Marzella Schlein, MD [facility]

## 2023-06-25 ENCOUNTER — Ambulatory Visit: Payer: 59 | Admitting: Family Medicine

## 2023-06-25 ENCOUNTER — Encounter: Payer: Self-pay | Admitting: Family Medicine

## 2023-06-25 VITALS — BP 99/58 | HR 66 | Ht 73.0 in | Wt 221.7 lb

## 2023-06-25 DIAGNOSIS — I959 Hypotension, unspecified: Secondary | ICD-10-CM

## 2023-06-25 DIAGNOSIS — Z8349 Family history of other endocrine, nutritional and metabolic diseases: Secondary | ICD-10-CM

## 2023-06-25 DIAGNOSIS — Z23 Encounter for immunization: Secondary | ICD-10-CM

## 2023-06-25 DIAGNOSIS — N401 Enlarged prostate with lower urinary tract symptoms: Secondary | ICD-10-CM | POA: Diagnosis not present

## 2023-06-25 DIAGNOSIS — Z0001 Encounter for general adult medical examination with abnormal findings: Secondary | ICD-10-CM

## 2023-06-25 DIAGNOSIS — Z0000 Encounter for general adult medical examination without abnormal findings: Secondary | ICD-10-CM

## 2023-06-25 DIAGNOSIS — Z125 Encounter for screening for malignant neoplasm of prostate: Secondary | ICD-10-CM

## 2023-06-25 DIAGNOSIS — E782 Mixed hyperlipidemia: Secondary | ICD-10-CM

## 2023-06-25 DIAGNOSIS — R7303 Prediabetes: Secondary | ICD-10-CM

## 2023-06-25 DIAGNOSIS — R351 Nocturia: Secondary | ICD-10-CM

## 2023-06-25 NOTE — Assessment & Plan Note (Signed)
Concern about increasing blood sugar levels. Monitoring A1c as part of routine labs. - Review A1c results - Provide guidance based on results

## 2023-06-25 NOTE — Assessment & Plan Note (Signed)
Concern about increasing cholesterol levels. Last year's ASCVD risk was 5.5%. Treatment considered if ASCVD risk is =7.5% with other risk factors or =10% without. Current risk factors include age and male gender. Emphasized smoking cessation and blood pressure control. - Review lab results and ASCVD risk - Discuss treatment options based on ASCVD risk

## 2023-06-25 NOTE — Assessment & Plan Note (Signed)
Reports occasional lightheadedness and dizziness, potentially related to tamsulosin. Tamsulosin effective for lower urinary tract symptoms but may contribute to hypotension. Discussed benefits and potential side effects. Advised on hydration and salt intake. - Encourage increased hydration - Advise increased salt intake - Monitor symptoms and consider medication adjustment if symptoms persist

## 2023-06-25 NOTE — Progress Notes (Signed)
Complete physical exam   Patient: Jonathan Gallegos   DOB: 1961-02-18   63 y.o. Male  MRN: [id number] Visit Date: 06/25/2023  Today's healthcare provider: Shirlee Latch, MD   Chief Complaint  Patient presents with   Annual Exam    Last completed 03/19/22 Diet -  General, well balanced Exercise - some trying to walk at least a mile a day but not so much lately Feeling - well with occasional moments of SOB on exertion Sleeping - fairly well, aware of sleep apnea Concerns - none   Subjective    Jonathan Gallegos is a 63 y.o. male who presents today for a complete physical exam.    Discussed the use of AI scribe software for clinical note transcription with the patient, who gave verbal consent to proceed.  History of Present Illness   The patient, with a history of high cholesterol, presents for a routine physical examination. He expresses concern about his cholesterol levels, which have been inching up, and recent weight gain, particularly after the holiday season. The patient admits to being less active due to cold weather but mentions engaging in physical activities like cutting trees over the weekend. He also reports occasional lightheadedness and dizziness, which he attributes to possible low blood pressure.  The patient has a history of eczema, which has been flaring up recently. He admits to not being diligent with the application of his prescribed triamcinolone cream. He also reports that he is currently on Flomax for prostate issues, which he believes might be contributing to his lightheadedness and dizziness.       The 10-year ASCVD risk score (Arnett DK, et al., 2019) is: 5.5%   Last depression screening scores    01/17/2023    3:36 PM 07/05/2022    4:24 PM 03/19/2022    3:55 PM  PHQ 2/9 Scores  PHQ - 2 Score 1 0 0  PHQ- 9 Score 1 1 0   Last fall risk screening    01/17/2023    3:36 PM  Fall Risk   Falls in the past year? 1  Number falls in past  yr: 1  Injury with Fall? 0  Risk for fall due to : History of fall(s)  Follow up Falls evaluation completed        Medications: Outpatient Medications Prior to Visit  Medication Sig   Albuterol Sulfate (PROAIR RESPICLICK) 108 (90 Base) MCG/ACT AEPB Inhale 1-2 puffs into the lungs every 4 (four) hours as needed.   EPINEPHrine (EPIPEN 2-PAK) 0.3 mg/0.3 mL IJ SOAJ injection Inject 0.3 mLs (0.3 mg total) into the muscle once.   tamsulosin (FLOMAX) 0.4 MG CAPS capsule TAKE 1 CAPSULE BY MOUTH ONCE DAILY AFTER SUPPER   triamcinolone cream (KENALOG) 0.1 % Apply 1 Application topically 2 (two) times daily.   omeprazole (PRILOSEC) 40 MG capsule Take 1 capsule (40 mg total) by mouth 2 (two) times daily before a meal.   [DISCONTINUED] clotrimazole (LOTRIMIN) 1 % cream Apply 1 Application topically 2 (two) times daily. (Patient not taking: Reported on 06/25/2023)   No facility-administered medications prior to visit.    Review of Systems    Objective    BP (!) 99/58 (BP Location: Left Arm, Patient Position: Sitting, Cuff Size: Normal) Comment: MAP 70  Pulse 66   Ht 6\' 1"  (1.854 m)   Wt 221 lb 11.2 oz (100.6 kg)   SpO2 99%   BMI 29.25 kg/m    Physical Exam Vitals reviewed.  Constitutional:      General: He is not in acute distress.    Appearance: Normal appearance. He is well-developed. He is not diaphoretic.  HENT:     Head: Normocephalic and atraumatic.     Right Ear: Tympanic membrane, ear canal and external ear normal.     Left Ear: Tympanic membrane, ear canal and external ear normal.     Nose: Nose normal.     Mouth/Throat:     Mouth: Mucous membranes are moist.     Pharynx: Oropharynx is clear. No oropharyngeal exudate.  Eyes:     General: No scleral icterus.    Conjunctiva/sclera: Conjunctivae normal.     Pupils: Pupils are equal, round, and reactive to light.  Neck:     Thyroid: No thyromegaly.  Cardiovascular:     Rate and Rhythm: Normal rate and regular rhythm.      Heart sounds: Normal heart sounds. No murmur heard. Pulmonary:     Effort: Pulmonary effort is normal. No respiratory distress.     Breath sounds: Normal breath sounds. No wheezing or rales.  Abdominal:     General: There is no distension.     Palpations: Abdomen is soft.     Tenderness: There is no abdominal tenderness.  Musculoskeletal:        General: No deformity.     Cervical back: Neck supple.     Right lower leg: No edema.     Left lower leg: No edema.  Lymphadenopathy:     Cervical: No cervical adenopathy.  Skin:    General: Skin is warm and dry.     Findings: No rash.  Neurological:     Mental Status: He is alert and oriented to person, place, and time. Mental status is at baseline.     Gait: Gait normal.  Psychiatric:        Mood and Affect: Mood normal.        Behavior: Behavior normal.        Thought Content: Thought content normal.      No results found for any visits on 06/25/23.  Assessment & Plan    Routine Health Maintenance and Physical Exam  Exercise Activities and Dietary recommendations  Goals   None     Immunization History  Administered Date(s) Administered   Influenza,inj,Quad PF,6+ Mos 03/10/2020, 03/19/2022   Influenza-Unspecified 04/12/2017, 03/17/2021   Tdap 08/10/2016   Zoster Recombinant(Shingrix) 09/09/2020, 03/17/2021    Health Maintenance  Topic Date Due   COVID-19 Vaccine (1 - 2024-25 season) Never done   INFLUENZA VACCINE  08/26/2023 (Originally 12/27/2022)   DTaP/Tdap/Td (2 - Td or Tdap) 08/11/2026   Colonoscopy  11/16/2026   Hepatitis C Screening  Completed   HIV Screening  Completed   Zoster Vaccines- Shingrix  Completed   HPV VACCINES  Aged Out    Discussed health benefits of physical activity, and encouraged him to engage in regular exercise appropriate for his age and condition.  Problem List Items Addressed This Visit       Other   BPH associated with nocturia   Reports occasional lightheadedness and  dizziness, potentially related to tamsulosin. Tamsulosin effective for lower urinary tract symptoms but may contribute to hypotension. Discussed benefits and potential side effects. Advised on hydration and salt intake. - Encourage increased hydration - Advise increased salt intake - Monitor symptoms and consider medication adjustment if symptoms persist      Relevant Orders   PSA Total (Reflex To Free)   Prediabetes  Concern about increasing blood sugar levels. Monitoring A1c as part of routine labs. - Review A1c results - Provide guidance based on results      Relevant Orders   Hemoglobin A1c   Moderate mixed hyperlipidemia not requiring statin therapy   Concern about increasing cholesterol levels. Last year's ASCVD risk was 5.5%. Treatment considered if ASCVD risk is =7.5% with other risk factors or =10% without. Current risk factors include age and male gender. Emphasized smoking cessation and blood pressure control. - Review lab results and ASCVD risk - Discuss treatment options based on ASCVD risk      Relevant Orders   Comprehensive metabolic panel   Lipid panel   Family history of thyroid disease   Relevant Orders   TSH   Other Visit Diagnoses       Encounter for annual physical exam    -  Primary   Relevant Orders   Hemoglobin A1c   Comprehensive metabolic panel   Lipid panel   PSA Total (Reflex To Free)   TSH     Prostate cancer screening       Relevant Orders   PSA Total (Reflex To Free)     Hypotension, unspecified hypotension type         Immunization due       Relevant Orders   Pneumococcal conjugate vaccine 20-valent           Eczema Chronic itching and rash, exacerbated by colder, drier weather. Using triamcinolone cream. Discussed chronic nature and need for consistent treatment. Advised regular moisturizing, especially post-shower. - Advise consistent use of triamcinolone cream - Recommend regular moisturizing, especially post-shower -  Encourage follow-up with dermatologist if symptoms persist  General Health Maintenance Annual wellness visit to review overall health, update vaccinations, and perform routine screenings. Patient declined flu shot. Agreed to receive new pneumonia vaccine today. - Order labs: cholesterol, kidney and liver function, A1c, thyroid function, PSA - Calculate ASCVD risk based on lab results - Administer pneumonia vaccine - Schedule annual physical for January 2026  Follow-up - Send lab results and ASCVD risk calculation via MyChart - Schedule follow-up if lab results indicate need for further intervention.        Return in about 1 year (around 06/24/2024) for CPE.     Shirlee Latch, MD  [facility] [phone] (phone) [phone] (fax)  [facility]

## 2023-06-26 LAB — PSA TOTAL (REFLEX TO FREE): Prostate Specific Ag, Serum: 1.3 ng/mL (ref 0.0–4.0)

## 2023-06-26 LAB — HEMOGLOBIN A1C
Est. average glucose Bld gHb Est-mCnc: 126 mg/dL
Hgb A1c MFr Bld: 6 % — ABNORMAL HIGH (ref 4.8–5.6)

## 2023-06-26 LAB — COMPREHENSIVE METABOLIC PANEL
ALT: 14 [IU]/L (ref 0–44)
AST: 21 [IU]/L (ref 0–40)
Albumin: 4.5 g/dL (ref 3.9–4.9)
Alkaline Phosphatase: 58 [IU]/L (ref 44–121)
BUN/Creatinine Ratio: 11 (ref 10–24)
BUN: 16 mg/dL (ref 8–27)
Bilirubin Total: 0.4 mg/dL (ref 0.0–1.2)
CO2: 27 mmol/L (ref 20–29)
Calcium: 9.8 mg/dL (ref 8.6–10.2)
Chloride: 101 mmol/L (ref 96–106)
Creatinine, Ser: 1.5 mg/dL — ABNORMAL HIGH (ref 0.76–1.27)
Globulin, Total: 2.3 g/dL (ref 1.5–4.5)
Glucose: 91 mg/dL (ref 70–99)
Potassium: 4.9 mmol/L (ref 3.5–5.2)
Sodium: 143 mmol/L (ref 134–144)
Total Protein: 6.8 g/dL (ref 6.0–8.5)
eGFR: 53 mL/min/{1.73_m2} — ABNORMAL LOW (ref >59)

## 2023-06-26 LAB — LIPID PANEL
Chol/HDL Ratio: 3 {ratio} (ref 0.0–5.0)
Cholesterol, Total: 249 mg/dL — ABNORMAL HIGH (ref 100–199)
HDL: 83 mg/dL (ref >39)
LDL Chol Calc (NIH): 146 mg/dL — ABNORMAL HIGH (ref 0–99)
Triglycerides: 115 mg/dL (ref 0–149)
VLDL Cholesterol Cal: 20 mg/dL (ref 5–40)

## 2023-06-26 LAB — TSH: TSH: 2.03 u[IU]/mL (ref 0.450–4.500)

## 2023-06-27 ENCOUNTER — Encounter: Payer: Self-pay | Admitting: Family Medicine

## 2023-06-27 DIAGNOSIS — N289 Disorder of kidney and ureter, unspecified: Secondary | ICD-10-CM

## 2023-07-09 ENCOUNTER — Encounter: Payer: Self-pay | Admitting: Family Medicine

## 2023-07-09 LAB — COMPREHENSIVE METABOLIC PANEL
ALT: 14 [IU]/L (ref 0–44)
AST: 22 [IU]/L (ref 0–40)
Albumin: 4.5 g/dL (ref 3.9–4.9)
Alkaline Phosphatase: 59 [IU]/L (ref 44–121)
BUN/Creatinine Ratio: 19 (ref 10–24)
BUN: 17 mg/dL (ref 8–27)
Bilirubin Total: 0.4 mg/dL (ref 0.0–1.2)
CO2: 24 mmol/L (ref 20–29)
Calcium: 9.5 mg/dL (ref 8.6–10.2)
Chloride: 101 mmol/L (ref 96–106)
Creatinine, Ser: 0.88 mg/dL (ref 0.76–1.27)
Globulin, Total: 2.2 g/dL (ref 1.5–4.5)
Glucose: 103 mg/dL — ABNORMAL HIGH (ref 70–99)
Potassium: 4.6 mmol/L (ref 3.5–5.2)
Sodium: 139 mmol/L (ref 134–144)
Total Protein: 6.7 g/dL (ref 6.0–8.5)
eGFR: 98 mL/min/{1.73_m2} (ref >59)

## 2023-07-18 ENCOUNTER — Telehealth: Payer: Self-pay | Admitting: Family Medicine

## 2023-07-18 ENCOUNTER — Other Ambulatory Visit: Payer: Self-pay

## 2023-07-18 DIAGNOSIS — N401 Enlarged prostate with lower urinary tract symptoms: Secondary | ICD-10-CM

## 2023-07-18 MED ORDER — TAMSULOSIN HCL 0.4 MG PO CAPS: ORAL_CAPSULE | ORAL | 0 refills | Status: DC

## 2023-07-18 NOTE — Telephone Encounter (Signed)
[facility] is requesting refill tamsulosin (FLOMAX) 0.4 MG CAPS capsule   Please advise

## 2023-08-21 ENCOUNTER — Encounter: Payer: Self-pay | Admitting: Family Medicine

## 2023-08-21 DIAGNOSIS — E782 Mixed hyperlipidemia: Secondary | ICD-10-CM

## 2023-09-10 ENCOUNTER — Ambulatory Visit
Admission: RE | Admit: 2023-09-10 | Discharge: 2023-09-10 | Disposition: A | Discharge status: Home / Self Care | Payer: Self-pay | Source: Ambulatory Visit | Attending: Family Medicine | Admitting: Family Medicine

## 2023-09-10 ENCOUNTER — Telehealth: Payer: Self-pay | Admitting: Family Medicine

## 2023-09-10 DIAGNOSIS — E782 Mixed hyperlipidemia: Secondary | ICD-10-CM | POA: Insufficient documentation

## 2023-09-10 NOTE — Telephone Encounter (Signed)
 If you could fill in lab results and vitals, I'll sign it to complete it. Thanks!

## 2023-09-10 NOTE — Telephone Encounter (Signed)
 Pt dropped off physician results form to be completed.  Please fax when complete.  Placed in Dr. Tanner Fanny box

## 2023-09-11 NOTE — Telephone Encounter (Signed)
Forms not in box

## 2023-09-12 ENCOUNTER — Encounter: Payer: Self-pay | Admitting: Family Medicine

## 2023-09-12 NOTE — Telephone Encounter (Signed)
 Deatiled VM left per DPR that forms are completed and will be available to be picked up.  Copy of forms placed on sorter to be faxed and scanned into chart

## 2023-09-13 NOTE — Telephone Encounter (Signed)
 Form faxed

## 2023-10-16 ENCOUNTER — Other Ambulatory Visit: Payer: Self-pay | Admitting: Family Medicine

## 2023-10-16 DIAGNOSIS — N401 Enlarged prostate with lower urinary tract symptoms: Secondary | ICD-10-CM

## 2024-01-29 ENCOUNTER — Other Ambulatory Visit: Payer: Self-pay | Admitting: Family Medicine

## 2024-01-29 DIAGNOSIS — N401 Enlarged prostate with lower urinary tract symptoms: Secondary | ICD-10-CM

## 2024-04-28 ENCOUNTER — Encounter: Payer: Self-pay | Admitting: Family Medicine

## 2024-04-28 MED ORDER — DESOXIMETASONE 0.25 % EX OINT: 1.0000 | TOPICAL_OINTMENT | Freq: Two times a day (BID) | CUTANEOUS | 5 refills | Status: AC

## 2024-06-25 ENCOUNTER — Encounter: Payer: 59 | Admitting: Family Medicine

## 2024-06-30 ENCOUNTER — Encounter: Admitting: Family Medicine

## 2024-08-06 ENCOUNTER — Encounter: Admitting: Family Medicine
